# Patient Record
Sex: Female | Born: 1980 | Race: Black or African American | Hispanic: No | Marital: Single | Smoking: Never smoker
Health system: Southern US, Community
[De-identification: ages and names within clinical notes are randomized; demographics above are authoritative.]

## PROBLEM LIST (undated history)

## (undated) DIAGNOSIS — R519 Headache, unspecified: Secondary | ICD-10-CM

## (undated) DIAGNOSIS — N83209 Unspecified ovarian cyst, unspecified side: Secondary | ICD-10-CM

## (undated) DIAGNOSIS — R51 Headache: Secondary | ICD-10-CM

## (undated) HISTORY — DX: Headache: R51

## (undated) HISTORY — PX: TUBAL LIGATION: SHX77

## (undated) HISTORY — DX: Headache, unspecified: R51.9

---

## 2009-02-23 ENCOUNTER — Emergency Department (HOSPITAL_COMMUNITY): Admission: EM | Admit: 2009-02-23 | Discharge: 2009-02-23 | Payer: Self-pay | Admitting: Emergency Medicine

## 2013-09-16 ENCOUNTER — Encounter (HOSPITAL_COMMUNITY): Payer: Self-pay | Admitting: Emergency Medicine

## 2013-09-16 ENCOUNTER — Emergency Department (HOSPITAL_COMMUNITY)
Admission: EM | Admit: 2013-09-16 | Discharge: 2013-09-17 | Disposition: A | Discharge status: Home / Self Care | Payer: 59 | Attending: Emergency Medicine | Admitting: Emergency Medicine

## 2013-09-16 ENCOUNTER — Emergency Department (HOSPITAL_COMMUNITY): Payer: 59

## 2013-09-16 DIAGNOSIS — N898 Other specified noninflammatory disorders of vagina: Secondary | ICD-10-CM | POA: Diagnosis not present

## 2013-09-16 DIAGNOSIS — Z79899 Other long term (current) drug therapy: Secondary | ICD-10-CM | POA: Diagnosis not present

## 2013-09-16 DIAGNOSIS — Z9889 Other specified postprocedural states: Secondary | ICD-10-CM | POA: Diagnosis not present

## 2013-09-16 DIAGNOSIS — Z3202 Encounter for pregnancy test, result negative: Secondary | ICD-10-CM | POA: Diagnosis not present

## 2013-09-16 DIAGNOSIS — Z87448 Personal history of other diseases of urinary system: Secondary | ICD-10-CM | POA: Insufficient documentation

## 2013-09-16 DIAGNOSIS — R1032 Left lower quadrant pain: Secondary | ICD-10-CM | POA: Diagnosis not present

## 2013-09-16 HISTORY — DX: Unspecified ovarian cyst, unspecified side: N83.209

## 2013-09-16 LAB — CBC WITH DIFFERENTIAL/PLATELET
Basophils Absolute: 0 10*3/uL (ref 0.0–0.1)
Basophils Relative: 0 % (ref 0–1)
EOS PCT: 1 % (ref 0–5)
Eosinophils Absolute: 0.1 10*3/uL (ref 0.0–0.7)
HEMATOCRIT: 36.3 % (ref 36.0–46.0)
Hemoglobin: 11.8 g/dL — ABNORMAL LOW (ref 12.0–15.0)
LYMPHS ABS: 2.5 10*3/uL (ref 0.7–4.0)
Lymphocytes Relative: 39 % (ref 12–46)
MCH: 27.9 pg (ref 26.0–34.0)
MCHC: 32.5 g/dL (ref 30.0–36.0)
MCV: 85.8 fL (ref 78.0–100.0)
Monocytes Absolute: 0.6 10*3/uL (ref 0.1–1.0)
Monocytes Relative: 9 % (ref 3–12)
Neutro Abs: 3.1 10*3/uL (ref 1.7–7.7)
Neutrophils Relative %: 51 % (ref 43–77)
Platelets: 257 10*3/uL (ref 150–400)
RBC: 4.23 MIL/uL (ref 3.87–5.11)
RDW: 13.4 % (ref 11.5–15.5)
WBC: 6.3 10*3/uL (ref 4.0–10.5)

## 2013-09-16 LAB — URINALYSIS, ROUTINE W REFLEX MICROSCOPIC
Bilirubin Urine: NEGATIVE
GLUCOSE, UA: NEGATIVE mg/dL
KETONES UR: NEGATIVE mg/dL
Nitrite: NEGATIVE
PROTEIN: NEGATIVE mg/dL
Specific Gravity, Urine: 1.026 (ref 1.005–1.030)
UROBILINOGEN UA: 1 mg/dL (ref 0.0–1.0)
pH: 5.5 (ref 5.0–8.0)

## 2013-09-16 LAB — COMPREHENSIVE METABOLIC PANEL
ALT: 15 U/L (ref 0–35)
AST: 20 U/L (ref 0–37)
Albumin: 3.3 g/dL — ABNORMAL LOW (ref 3.5–5.2)
Alkaline Phosphatase: 77 U/L (ref 39–117)
Anion gap: 10 (ref 5–15)
BUN: 9 mg/dL (ref 6–23)
CALCIUM: 9 mg/dL (ref 8.4–10.5)
CO2: 23 meq/L (ref 19–32)
Chloride: 105 mEq/L (ref 96–112)
Creatinine, Ser: 0.8 mg/dL (ref 0.50–1.10)
Glucose, Bld: 89 mg/dL (ref 70–99)
Potassium: 3.9 mEq/L (ref 3.7–5.3)
SODIUM: 138 meq/L (ref 137–147)
TOTAL PROTEIN: 7.3 g/dL (ref 6.0–8.3)
Total Bilirubin: <0.2 mg/dL — ABNORMAL LOW (ref 0.3–1.2)

## 2013-09-16 LAB — WET PREP, GENITAL
CLUE CELLS WET PREP: NONE SEEN
TRICH WET PREP: NONE SEEN
Yeast Wet Prep HPF POC: NONE SEEN

## 2013-09-16 LAB — URINE MICROSCOPIC-ADD ON

## 2013-09-16 LAB — LIPASE, BLOOD: Lipase: 32 U/L (ref 11–59)

## 2013-09-16 LAB — PREGNANCY, URINE: PREG TEST UR: NEGATIVE

## 2013-09-16 MED ORDER — SODIUM CHLORIDE 0.9 % IV BOLUS (SEPSIS)
1000.0000 mL | Freq: Once | INTRAVENOUS | Status: AC
  Administered 2013-09-16: 1000 mL via INTRAVENOUS

## 2013-09-16 MED ORDER — KETOROLAC TROMETHAMINE 30 MG/ML IJ SOLN
30.0000 mg | Freq: Once | INTRAMUSCULAR | Status: AC
  Administered 2013-09-16: 30 mg via INTRAVENOUS
  Filled 2013-09-16: qty 1

## 2013-09-16 NOTE — ED Notes (Signed)
Pt c/o lower abd pain that started this morning, denies n/v, bleeding or discharge.

## 2013-09-16 NOTE — ED Provider Notes (Signed)
CSN: [id number]     Arrival date & time 09/16/13  1621 History   First MD Initiated Contact with Patient 09/16/13 2007     Chief Complaint  Patient presents with  . Abdominal Pain   Patient is a 33 y.o. female presenting with abdominal pain. The history is provided by the patient. No language interpreter was used.  Abdominal Pain Pain location:  LLQ Pain quality: aching and cramping   Pain radiates to:  Does not radiate Pain severity:  Moderate Onset quality:  Gradual Duration:  1 day Timing:  Intermittent Progression:  Worsening Chronicity:  New Context: recent sexual activity   Context: not alcohol use, not awakening from sleep, not diet changes, not eating, not laxative use, not medication withdrawal, not previous surgeries, not recent illness, not recent travel, not retching, not sick contacts, not suspicious food intake and not trauma   Relieved by:  Nothing Worsened by:  Nothing tried Ineffective treatments:  None tried Associated symptoms: vaginal bleeding   Associated symptoms: no anorexia, no belching, no chest pain, no chills, no constipation, no cough, no diarrhea, no dysuria, no fatigue, no fever, no flatus, no hematemesis, no hematochezia, no hematuria, no melena, no nausea, no shortness of breath, no sore throat, no vaginal discharge and no vomiting   Associated symptoms comment:  Vaginal bleeding, Patient on her menstrual cycle Risk factors: no alcohol abuse, no aspirin use, not elderly, has not had multiple surgeries, no NSAID use, not obese, not pregnant and no recent hospitalization     Past Medical History  Diagnosis Date  . Ruptured cyst of ovary right   Past Surgical History  Procedure Laterality Date  . Cesarean section     No family history on file. History  Substance Use Topics  . Smoking status: Never Smoker   . Smokeless tobacco: Not on file  . Alcohol Use: No   OB History   Grav Para Term Preterm Abortions TAB SAB Ect Mult Living                  Review of Systems  Constitutional: Negative for fever, chills and fatigue.  HENT: Negative for sore throat.   Respiratory: Negative for cough and shortness of breath.   Cardiovascular: Negative for chest pain.  Gastrointestinal: Positive for abdominal pain. Negative for nausea, vomiting, diarrhea, constipation, melena, hematochezia, anorexia, flatus and hematemesis.  Genitourinary: Positive for vaginal bleeding. Negative for dysuria, hematuria and vaginal discharge.      Allergies  Zantac  Home Medications   Prior to Admission medications   Medication Sig Start Date End Date Taking? Authorizing Provider  ferrous sulfate 325 (65 FE) MG tablet Take 325 mg by mouth 2 (two) times daily with a meal.   Yes Historical Provider, MD  traMADol (ULTRAM) 50 MG tablet Take 1 tablet (50 mg total) by mouth every 6 (six) hours as needed. 09/17/13   Riyansh Gerstner A Forcucci, PA-C   BP 101/57  Pulse 72  Temp(Src) 98.7 F (37.1 C) (Oral)  Resp 18  SpO2 100%  LMP 09/12/2013 Physical Exam  Nursing note and vitals reviewed. Constitutional: She is oriented to person, place, and time. She appears well-developed and well-nourished. No distress.  HENT:  Head: Normocephalic and atraumatic.  Mouth/Throat: Oropharynx is clear and moist. No oropharyngeal exudate.  Eyes: Conjunctivae and EOM are normal. Pupils are equal, round, and reactive to light. No scleral icterus.  Neck: Normal range of motion. Neck supple. No JVD present. No thyromegaly present.  Cardiovascular: Normal  rate, regular rhythm, normal heart sounds and intact distal pulses.  Exam reveals no gallop and no friction rub.   No murmur heard. Pulmonary/Chest: Effort normal and breath sounds normal. No respiratory distress. She has no wheezes. She has no rales. She exhibits no tenderness.  Abdominal: Soft. Normal appearance and bowel sounds are normal. She exhibits no distension and no mass. There is tenderness in the suprapubic area and left lower  quadrant. There is no rigidity, no rebound, no guarding, no CVA tenderness, no tenderness at McBurney's point and negative Murphy's sign.  Genitourinary: Vagina normal. No labial fusion. There is no rash, tenderness, lesion or injury on the right labia. There is no rash, tenderness, lesion or injury on the left labia. Cervix exhibits discharge. Cervix exhibits no motion tenderness and no friability. Right adnexum displays no mass, no tenderness and no fullness. Left adnexum displays tenderness. Left adnexum displays no mass and no fullness. No erythema, tenderness or bleeding around the vagina. No foreign body around the vagina. No signs of injury around the vagina. No vaginal discharge found.  Scant amounts of brown blood in the vaginal vault  Musculoskeletal: Normal range of motion.  Lymphadenopathy:    She has no cervical adenopathy.  Neurological: She is alert and oriented to person, place, and time.  Skin: Skin is warm and dry. She is not diaphoretic.  Psychiatric: She has a normal mood and affect. Her behavior is normal. Judgment and thought content normal.    ED Course  Procedures (including critical care time) Labs Review Labs Reviewed  WET PREP, GENITAL - Abnormal; Notable for the following:    WBC, Wet Prep HPF POC FEW (*)    All other components within normal limits  CBC WITH DIFFERENTIAL - Abnormal; Notable for the following:    Hemoglobin 11.8 (*)    All other components within normal limits  COMPREHENSIVE METABOLIC PANEL - Abnormal; Notable for the following:    Albumin 3.3 (*)    Total Bilirubin <0.2 (*)    All other components within normal limits  URINALYSIS, ROUTINE W REFLEX MICROSCOPIC - Abnormal; Notable for the following:    Color, Urine AMBER (*)    APPearance CLOUDY (*)    Hgb urine dipstick LARGE (*)    Leukocytes, UA SMALL (*)    All other components within normal limits  URINE MICROSCOPIC-ADD ON - Abnormal; Notable for the following:    Squamous Epithelial /  LPF MANY (*)    Bacteria, UA FEW (*)    All other components within normal limits  GC/CHLAMYDIA PROBE AMP  LIPASE, BLOOD  PREGNANCY, URINE  HIV ANTIBODY (ROUTINE TESTING)    Imaging Review [place] Transvaginal Non-ob  09/17/2013   CLINICAL DATA:  Abdominal pain.  Rule out ruptured ovarian cyst.  EXAM: TRANSABDOMINAL AND TRANSVAGINAL ULTRASOUND OF PELVIS  TECHNIQUE: Both transabdominal and transvaginal ultrasound examinations of the pelvis were performed. Transabdominal technique was performed for global imaging of the pelvis including uterus, ovaries, adnexal regions, and pelvic cul-de-sac. It was necessary to proceed with endovaginal exam following the transabdominal exam to visualize the ovaries.  COMPARISON:  None  FINDINGS: Uterus  Measurements: 9.0 x 4.2 x 5.4 cm. No fibroids or other mass visualized.  Endometrium  Thickness: 9.4 mm.  No focal abnormality visualized.  Right ovary  Measurements: 2.9 x 1.5 x 1.7 cm. Normal appearance/no adnexal mass.  Left ovary  Measurements: 3.9 x 2.6 x 4.1 cm. Normal appearance/no adnexal mass.  Other findings  No free fluid.  IMPRESSION: Negative   Electronically Signed   By: Marlan Palau M.D.   On: 09/17/2013 00:56   US Pelvis Complete  09/17/2013   CLINICAL DATA:  Abdominal pain.  Rule out ruptured ovarian cyst.  EXAM: TRANSABDOMINAL AND TRANSVAGINAL ULTRASOUND OF PELVIS  TECHNIQUE: Both transabdominal and transvaginal ultrasound examinations of the pelvis were performed. Transabdominal technique was performed for global imaging of the pelvis including uterus, ovaries, adnexal regions, and pelvic cul-de-sac. It was necessary to proceed with endovaginal exam following the transabdominal exam to visualize the ovaries.  COMPARISON:  None  FINDINGS: Uterus  Measurements: 9.0 x 4.2 x 5.4 cm. No fibroids or other mass visualized.  Endometrium  Thickness: 9.4 mm.  No focal abnormality visualized.  Right ovary  Measurements: 2.9 x 1.5 x 1.7 cm. Normal appearance/no adnexal  mass.  Left ovary  Measurements: 3.9 x 2.6 x 4.1 cm. Normal appearance/no adnexal mass.  Other findings  No free fluid.  IMPRESSION: Negative   Electronically Signed   By: Marlan Palau M.D.   On: 09/17/2013 00:56     EKG Interpretation None      MDM   Final diagnoses:  LLQ abdominal pain   Patient is a 33 y.o. Female who presents to the ED with LLQ and suprapubic abdominal pain x 1 day.  CBC, CMP, Lipase are unremarkable at this time.  GC and rapid HIV are currently pending at this time.  Patient did have some mild left adnexal tenderness and history of ovarian cysts.  Will obtain a pelvic ultrasound at this time to rule out TOA and ovarian torsion at this time.  Pelvic ultrasound shows no acute abnormalities at this time.  UA appears to be contaminated at this time as patient is currently on her menstrual cycle.  Labs are reassuring at this time and pelvic ultrasound is negative at this time for torsion.  Patient is stable for discharge at this time.  I will send the patient home with tramadol for pain management at this time and have told the patient to follow-up with her PCP this week.  Patient was given strict return precautions of GI bleeding, fever, intractable abdominal pain, and intractable nausea and vomiting.  Patient was discussed with Dr. Rhunette Croft and he agrees with the above plan.    Eben Burow, PA-C 09/17/13 [phone]

## 2013-09-17 LAB — HIV ANTIBODY (ROUTINE TESTING W REFLEX): HIV: NONREACTIVE

## 2013-09-17 LAB — GC/CHLAMYDIA PROBE AMP
CT Probe RNA: NEGATIVE
GC Probe RNA: NEGATIVE

## 2013-09-17 MED ORDER — TRAMADOL HCL 50 MG PO TABS: 50.0000 mg | ORAL_TABLET | Freq: Four times a day (QID) | ORAL | Status: DC | PRN

## 2013-09-17 NOTE — Discharge Instructions (Signed)
Abdominal Pain, Women °Abdominal (stomach, pelvic, or belly) pain can be caused by many things. It is important to tell your doctor: °· The location of the pain. °· Does it come and go or is it present all the time? °· Are there things that start the pain (eating certain foods, exercise)? °· Are there other symptoms associated with the pain (fever, nausea, vomiting, diarrhea)? °All of this is helpful to know when trying to find the cause of the pain. °CAUSES  °· Stomach: virus or bacteria infection, or ulcer. °· Intestine: appendicitis (inflamed appendix), regional ileitis (Crohn's disease), ulcerative colitis (inflamed colon), irritable bowel syndrome, diverticulitis (inflamed diverticulum of the colon), or cancer of the stomach or intestine. °· Gallbladder disease or stones in the gallbladder. °· Kidney disease, kidney stones, or infection. °· Pancreas infection or cancer. °· Fibromyalgia (pain disorder). °· Diseases of the female organs: °¨ Uterus: fibroid (non-cancerous) tumors or infection. °¨ Fallopian tubes: infection or tubal pregnancy. °¨ Ovary: cysts or tumors. °¨ Pelvic adhesions (scar tissue). °¨ Endometriosis (uterus lining tissue growing in the pelvis and on the pelvic organs). °¨ Pelvic congestion syndrome (female organs filling up with blood just before the menstrual period). °¨ Pain with the menstrual period. °¨ Pain with ovulation (producing an egg). °¨ Pain with an IUD (intrauterine device, birth control) in the uterus. °¨ Cancer of the female organs. °· Functional pain (pain not caused by a disease, may improve without treatment). °· Psychological pain. °· Depression. °DIAGNOSIS  °Your doctor will decide the seriousness of your pain by doing an examination. °· Blood tests. °· X-rays. °· Ultrasound. °· CT scan (computed tomography, special type of X-ray). °· MRI (magnetic resonance imaging). °· Cultures, for infection. °· Barium enema (dye inserted in the large intestine, to better view it with  X-rays). °· Colonoscopy (looking in intestine with a lighted tube). °· Laparoscopy (minor surgery, looking in abdomen with a lighted tube). °· Major abdominal exploratory surgery (looking in abdomen with a large incision). °TREATMENT  °The treatment will depend on the cause of the pain.  °· Many cases can be observed and treated at home. °· Over-the-counter medicines recommended by your caregiver. °· Prescription medicine. °· Antibiotics, for infection. °· Birth control pills, for painful periods or for ovulation pain. °· Hormone treatment, for endometriosis. °· Nerve blocking injections. °· Physical therapy. °· Antidepressants. °· Counseling with a psychologist or psychiatrist. °· Minor or major surgery. °HOME CARE INSTRUCTIONS  °· Do not take laxatives, unless directed by your caregiver. °· Take over-the-counter pain medicine only if ordered by your caregiver. Do not take aspirin because it can cause an upset stomach or bleeding. °· Try a clear liquid diet (broth or water) as ordered by your caregiver. Slowly move to a bland diet, as tolerated, if the pain is related to the stomach or intestine. °· Have a thermometer and take your temperature several times a day, and record it. °· Bed rest and sleep, if it helps the pain. °· Avoid sexual intercourse, if it causes pain. °· Avoid stressful situations. °· Keep your follow-up appointments and tests, as your caregiver orders. °· If the pain does not go away with medicine or surgery, you may try: °¨ Acupuncture. °¨ Relaxation exercises (yoga, meditation). °¨ Group therapy. °¨ Counseling. °SEEK MEDICAL CARE IF:  °· You notice certain foods cause stomach pain. °· Your home care treatment is not helping your pain. °· You need stronger pain medicine. °· You want your IUD removed. °· You feel faint or   lightheaded. °· You develop nausea and vomiting. °· You develop a rash. °· You are having side effects or an allergy to your medicine. °SEEK IMMEDIATE MEDICAL CARE IF:  °· Your  pain does not go away or gets worse. °· You have a fever. °· Your pain is felt only in portions of the abdomen. The right side could possibly be appendicitis. The left lower portion of the abdomen could be colitis or diverticulitis. °· You are passing blood in your stools (bright red or black tarry stools, with or without vomiting). °· You have blood in your urine. °· You develop chills, with or without a fever. °· You pass out. °MAKE SURE YOU:  °· Understand these instructions. °· Will watch your condition. °· Will get help right away if you are not doing well or get worse. °Document Released: 11/28/2006 Document Revised: 06/17/2013 Document Reviewed: 12/18/2008 °[employer]® Patient Information ©2015 [employer]. This information is not intended to replace advice given to you by your health care provider. Make sure you discuss any questions you have with your health care provider. ° °

## 2013-09-17 NOTE — ED Notes (Signed)
Patient transported to Ultrasound 

## 2013-09-27 NOTE — ED Provider Notes (Signed)
Medical screening examination/treatment/procedure(s) were performed by non-physician practitioner and as supervising physician I was immediately available for consultation/collaboration.   EKG Interpretation None       Derwood KaplanAnkit Adriene Padula, MD 09/27/13 [phone]

## 2013-10-03 ENCOUNTER — Encounter (HOSPITAL_COMMUNITY): Payer: Self-pay | Admitting: Emergency Medicine

## 2013-10-03 DIAGNOSIS — Z8742 Personal history of other diseases of the female genital tract: Secondary | ICD-10-CM | POA: Insufficient documentation

## 2013-10-03 DIAGNOSIS — Z792 Long term (current) use of antibiotics: Secondary | ICD-10-CM | POA: Diagnosis not present

## 2013-10-03 DIAGNOSIS — R52 Pain, unspecified: Secondary | ICD-10-CM | POA: Diagnosis not present

## 2013-10-03 DIAGNOSIS — Z79899 Other long term (current) drug therapy: Secondary | ICD-10-CM | POA: Diagnosis not present

## 2013-10-03 DIAGNOSIS — R509 Fever, unspecified: Secondary | ICD-10-CM | POA: Insufficient documentation

## 2013-10-03 DIAGNOSIS — J069 Acute upper respiratory infection, unspecified: Secondary | ICD-10-CM | POA: Diagnosis not present

## 2013-10-03 LAB — CBC WITH DIFFERENTIAL/PLATELET
Basophils Absolute: 0 10*3/uL (ref 0.0–0.1)
Basophils Relative: 0 % (ref 0–1)
EOS ABS: 0 10*3/uL (ref 0.0–0.7)
EOS PCT: 0 % (ref 0–5)
HCT: 40.8 % (ref 36.0–46.0)
HEMOGLOBIN: 13.7 g/dL (ref 12.0–15.0)
LYMPHS ABS: 1.2 10*3/uL (ref 0.7–4.0)
Lymphocytes Relative: 20 % (ref 12–46)
MCH: 28.1 pg (ref 26.0–34.0)
MCHC: 33.6 g/dL (ref 30.0–36.0)
MCV: 83.8 fL (ref 78.0–100.0)
MONO ABS: 0.9 10*3/uL (ref 0.1–1.0)
MONOS PCT: 15 % — AB (ref 3–12)
Neutro Abs: 3.7 10*3/uL (ref 1.7–7.7)
Neutrophils Relative %: 65 % (ref 43–77)
Platelets: 235 10*3/uL (ref 150–400)
RBC: 4.87 MIL/uL (ref 3.87–5.11)
RDW: 13 % (ref 11.5–15.5)
WBC: 5.8 10*3/uL (ref 4.0–10.5)

## 2013-10-03 LAB — I-STAT CG4 LACTIC ACID, ED: LACTIC ACID, VENOUS: 1.54 mmol/L (ref 0.5–2.2)

## 2013-10-03 NOTE — ED Notes (Addendum)
Pt reports generalized body aches and fever x 3 days. States she checked it today at it was 104.2. States she took 500 mg Tylenol appx 4 hours ago. Denies pain. Denies urinary symp. Denies cough/CP. PT AO x 4. NAD.

## 2013-10-04 ENCOUNTER — Emergency Department (HOSPITAL_COMMUNITY)
Admission: EM | Admit: 2013-10-04 | Discharge: 2013-10-04 | Disposition: A | Discharge status: Home / Self Care | Payer: 59 | Attending: Emergency Medicine | Admitting: Emergency Medicine

## 2013-10-04 DIAGNOSIS — J069 Acute upper respiratory infection, unspecified: Secondary | ICD-10-CM

## 2013-10-04 LAB — COMPREHENSIVE METABOLIC PANEL
ALK PHOS: 85 U/L (ref 39–117)
ALT: 53 U/L — ABNORMAL HIGH (ref 0–35)
AST: 52 U/L — ABNORMAL HIGH (ref 0–37)
Albumin: 3.5 g/dL (ref 3.5–5.2)
Anion gap: 12 (ref 5–15)
BUN: 9 mg/dL (ref 6–23)
CALCIUM: 9.8 mg/dL (ref 8.4–10.5)
CO2: 23 mEq/L (ref 19–32)
Chloride: 99 mEq/L (ref 96–112)
Creatinine, Ser: 0.76 mg/dL (ref 0.50–1.10)
GFR calc non Af Amer: >90 mL/min (ref >90)
Glucose, Bld: 117 mg/dL — ABNORMAL HIGH (ref 70–99)
Potassium: 4.6 mEq/L (ref 3.7–5.3)
Sodium: 134 mEq/L — ABNORMAL LOW (ref 137–147)
TOTAL PROTEIN: 8.5 g/dL — AB (ref 6.0–8.3)
Total Bilirubin: 0.2 mg/dL — ABNORMAL LOW (ref 0.3–1.2)

## 2013-10-04 MED ORDER — AZITHROMYCIN 250 MG PO TABS: ORAL_TABLET | ORAL | Status: DC

## 2013-10-04 MED ORDER — ACETAMINOPHEN 325 MG PO TABS
650.0000 mg | ORAL_TABLET | Freq: Once | ORAL | Status: AC
  Administered 2013-10-04: 650 mg via ORAL
  Filled 2013-10-04: qty 2

## 2013-10-04 NOTE — Discharge Instructions (Signed)
Get plenty of rest, drink a lot of fluids. Use Tylenol, for fever.  Start the prescription if you get worse or develop a cough productive of abnormal sputum (discolored).    Upper Respiratory Infection, Adult An upper respiratory infection (URI) is also sometimes known as the common cold. The upper respiratory tract includes the nose, sinuses, throat, trachea, and bronchi. Bronchi are the airways leading to the lungs. Most people improve within 1 week, but symptoms can last up to 2 weeks. A residual cough may last even longer.  CAUSES Many different viruses can infect the tissues lining the upper respiratory tract. The tissues become irritated and inflamed and often become very moist. Mucus production is also common. A cold is contagious. You can easily spread the virus to others by oral contact. This includes kissing, sharing a glass, coughing, or sneezing. Touching your mouth or nose and then touching a surface, which is then touched by another person, can also spread the virus. SYMPTOMS  Symptoms typically develop 1 to 3 days after you come in contact with a cold virus. Symptoms vary from person to person. They may include:  Runny nose.  Sneezing.  Nasal congestion.  Sinus irritation.  Sore throat.  Loss of voice (laryngitis).  Cough.  Fatigue.  Muscle aches.  Loss of appetite.  Headache.  Low-grade fever. DIAGNOSIS  You might diagnose your own cold based on familiar symptoms, since most people get a cold 2 to 3 times a year. Your caregiver can confirm this based on your exam. Most importantly, your caregiver can check that your symptoms are not due to another disease such as strep throat, sinusitis, pneumonia, asthma, or epiglottitis. Blood tests, throat tests, and X-rays are not necessary to diagnose a common cold, but they may sometimes be helpful in excluding other more serious diseases. Your caregiver will decide if any further tests are required. RISKS AND  COMPLICATIONS  You may be at risk for a more severe case of the common cold if you smoke cigarettes, have chronic heart disease (such as heart failure) or lung disease (such as asthma), or if you have a weakened immune system. The very young and very old are also at risk for more serious infections. Bacterial sinusitis, middle ear infections, and bacterial pneumonia can complicate the common cold. The common cold can worsen asthma and chronic obstructive pulmonary disease (COPD). Sometimes, these complications can require emergency medical care and may be life-threatening. PREVENTION  The best way to protect against getting a cold is to practice good hygiene. Avoid oral or hand contact with people with cold symptoms. Wash your hands often if contact occurs. There is no clear evidence that vitamin C, vitamin E, echinacea, or exercise reduces the chance of developing a cold. However, it is always recommended to get plenty of rest and practice good nutrition. TREATMENT  Treatment is directed at relieving symptoms. There is no cure. Antibiotics are not effective, because the infection is caused by a virus, not by bacteria. Treatment may include:  Increased fluid intake. Sports drinks offer valuable electrolytes, sugars, and fluids.  Breathing heated mist or steam (vaporizer or shower).  Eating chicken soup or other clear broths, and maintaining good nutrition.  Getting plenty of rest.  Using gargles or lozenges for comfort.  Controlling fevers with ibuprofen or acetaminophen as directed by your caregiver.  Increasing usage of your inhaler if you have asthma. Zinc gel and zinc lozenges, taken in the first 24 hours of the common cold, can shorten the  duration and lessen the severity of symptoms. Pain medicines may help with fever, muscle aches, and throat pain. A variety of non-prescription medicines are available to treat congestion and runny nose. Your caregiver can make recommendations and may  suggest nasal or lung inhalers for other symptoms.  HOME CARE INSTRUCTIONS   Only take over-the-counter or prescription medicines for pain, discomfort, or fever as directed by your caregiver.  Use a warm mist humidifier or inhale steam from a shower to increase air moisture. This may keep secretions moist and make it easier to breathe.  Drink enough water and fluids to keep your urine clear or pale yellow.  Rest as needed.  Return to work when your temperature has returned to normal or as your caregiver advises. You may need to stay home longer to avoid infecting others. You can also use a face mask and careful hand washing to prevent spread of the virus. SEEK MEDICAL CARE IF:   After the first few days, you feel you are getting worse rather than better.  You need your caregiver's advice about medicines to control symptoms.  You develop chills, worsening shortness of breath, or brown or red sputum. These may be signs of pneumonia.  You develop yellow or brown nasal discharge or pain in the face, especially when you bend forward. These may be signs of sinusitis.  You develop a fever, swollen neck glands, pain with swallowing, or white areas in the back of your throat. These may be signs of strep throat. SEEK IMMEDIATE MEDICAL CARE IF:   You have a fever.  You develop severe or persistent headache, ear pain, sinus pain, or chest pain.  You develop wheezing, a prolonged cough, cough up blood, or have a change in your usual mucus (if you have chronic lung disease).  You develop sore muscles or a stiff neck. Document Released: 07/27/2000 Document Revised: 04/25/2011 Document Reviewed: 05/08/2013 [facility] Patient Information 2015 [place], [place]. This information is not intended to replace advice given to you by your health care provider. Make sure you discuss any questions you have with your health care provider.

## 2013-10-04 NOTE — ED Provider Notes (Signed)
CSN: [id number]     Arrival date & time 10/03/13  2234 History   First MD Initiated Contact with Patient 10/04/13 0129     Chief Complaint  Patient presents with  . Fever  . Generalized Body Aches     (Consider location/radiation/quality/duration/timing/severity/associated sxs/prior Treatment) HPI Amy Castro is a 33 y.o. female who presents for evaluation of general achiness sneezing, coughing, sore throat. She denies productive cough. She has not had a fever. No sick contacts. She denies nausea, vomiting, dysuria, urinary frequency, or diarrhea. There are no other known modifying factors  Past Medical History  Diagnosis Date  . Ruptured cyst of ovary right   Past Surgical History  Procedure Laterality Date  . Cesarean section     No family history on file. History  Substance Use Topics  . Smoking status: Never Smoker   . Smokeless tobacco: Not on file  . Alcohol Use: No   OB History   Grav Para Term Preterm Abortions TAB SAB Ect Mult Living                 Review of Systems  All other systems reviewed and are negative.     Allergies  Zantac  Home Medications   Prior to Admission medications   Medication Sig Start Date End Date Taking? Authorizing Provider  azithromycin (ZITHROMAX Z-PAK) 250 MG tablet 2 po day one, then 1 daily x 4 days 10/04/13   Flint Melter, MD  ferrous sulfate 325 (65 FE) MG tablet Take 325 mg by mouth 2 (two) times daily with a meal.    Historical Provider, MD  traMADol (ULTRAM) 50 MG tablet Take 1 tablet (50 mg total) by mouth every 6 (six) hours as needed. 09/17/13   Courtney A Forcucci, PA-C   BP 139/51  Pulse 107  Temp(Src) 103 F (39.4 C) (Oral)  Resp 18  SpO2 100%  LMP 09/12/2013 Physical Exam  Nursing note and vitals reviewed. Constitutional: She is oriented to person, place, and time. She appears well-developed and well-nourished. No distress.  HENT:  Head: Normocephalic and atraumatic.  Eyes: Conjunctivae and EOM are  normal. Pupils are equal, round, and reactive to light.  Neck: Normal range of motion and phonation normal. Neck supple.  Cardiovascular: Normal rate, regular rhythm and intact distal pulses.   Pulmonary/Chest: Effort normal and breath sounds normal. No respiratory distress. She has no wheezes. She has no rales. She exhibits no tenderness.  Few scattered rhonchi, primarily left-sided.  Abdominal: Soft. She exhibits no distension. There is no tenderness. There is no guarding.  Musculoskeletal: Normal range of motion.  Neurological: She is alert and oriented to person, place, and time. She exhibits normal muscle tone.  Skin: Skin is warm and dry.  Psychiatric: She has a normal mood and affect. Her behavior is normal. Judgment and thought content normal.    ED Course  Procedures (including critical care time) . Findings this is a patient, all questions answered.  Labs Review Labs Reviewed  CBC WITH DIFFERENTIAL - Abnormal; Notable for the following:    Monocytes Relative 15 (*)    All other components within normal limits  COMPREHENSIVE METABOLIC PANEL - Abnormal; Notable for the following:    Sodium 134 (*)    Glucose, Bld 117 (*)    Total Protein 8.5 (*)    AST 52 (*)    ALT 53 (*)    Total Bilirubin 0.2 (*)    All other components within normal limits  URINE  CULTURE  URINALYSIS, ROUTINE W REFLEX MICROSCOPIC  I-STAT CG4 LACTIC ACID, ED    Imaging Review No results found.   EKG Interpretation None      MDM   Final diagnoses:  URI (upper respiratory infection)    Which is consistent with a URI. Patient is nontoxic. She is given a prescription for Zithromax to use on a wait and see approach. She plans on starting it, if her symptoms worsen.  Nursing Notes Reviewed/ Care Coordinated Applicable Imaging Reviewed Interpretation of Laboratory Data incorporated into ED treatment  The patient appears reasonably screened and/or stabilized for discharge and I doubt any other  medical condition or other [facility] requiring further screening, evaluation, or treatment in the ED at this time prior to discharge.  Plan: Home Medications- Z-pack, Tylenol; Home Treatments- rest; return here if the recommended treatment, does not improve the symptoms; Recommended follow up- PCP prn   Flint MelterElliott L Peter Keyworth, MD 10/04/13 0200

## 2014-05-05 ENCOUNTER — Other Ambulatory Visit (HOSPITAL_COMMUNITY)
Admission: RE | Admit: 2014-05-05 | Discharge: 2014-05-05 | Disposition: A | Discharge status: Home / Self Care | Payer: Federal, State, Local not specified - PPO | Source: Ambulatory Visit | Attending: Obstetrics & Gynecology | Admitting: Obstetrics & Gynecology

## 2014-05-05 ENCOUNTER — Other Ambulatory Visit: Payer: Self-pay | Admitting: Obstetrics & Gynecology

## 2014-05-05 DIAGNOSIS — Z01411 Encounter for gynecological examination (general) (routine) with abnormal findings: Secondary | ICD-10-CM | POA: Diagnosis not present

## 2014-05-05 DIAGNOSIS — Z1151 Encounter for screening for human papillomavirus (HPV): Secondary | ICD-10-CM | POA: Diagnosis not present

## 2014-05-06 LAB — CYTOLOGY - PAP

## 2015-05-18 DIAGNOSIS — R609 Edema, unspecified: Secondary | ICD-10-CM | POA: Diagnosis not present

## 2015-06-25 DIAGNOSIS — K080 Exfoliation of teeth due to systemic causes: Secondary | ICD-10-CM | POA: Diagnosis not present

## 2015-06-29 ENCOUNTER — Ambulatory Visit (INDEPENDENT_AMBULATORY_CARE_PROVIDER_SITE_OTHER): Payer: Federal, State, Local not specified - PPO | Admitting: Podiatry

## 2015-06-29 ENCOUNTER — Ambulatory Visit (INDEPENDENT_AMBULATORY_CARE_PROVIDER_SITE_OTHER): Payer: Federal, State, Local not specified - PPO

## 2015-06-29 ENCOUNTER — Encounter: Payer: Self-pay | Admitting: Podiatry

## 2015-06-29 VITALS — BP 109/65 | HR 70 | Resp 18

## 2015-06-29 DIAGNOSIS — M25579 Pain in unspecified ankle and joints of unspecified foot: Secondary | ICD-10-CM

## 2015-06-29 DIAGNOSIS — R52 Pain, unspecified: Secondary | ICD-10-CM | POA: Diagnosis not present

## 2015-06-29 DIAGNOSIS — G5750 Tarsal tunnel syndrome, unspecified lower limb: Secondary | ICD-10-CM

## 2015-06-29 DIAGNOSIS — M779 Enthesopathy, unspecified: Secondary | ICD-10-CM

## 2015-06-29 DIAGNOSIS — M2140 Flat foot [pes planus] (acquired), unspecified foot: Secondary | ICD-10-CM | POA: Diagnosis not present

## 2015-06-29 DIAGNOSIS — R609 Edema, unspecified: Secondary | ICD-10-CM

## 2015-06-29 MED ORDER — MELOXICAM 15 MG PO TABS: 15.0000 mg | ORAL_TABLET | Freq: Every day | ORAL | Status: AC

## 2015-06-29 NOTE — Progress Notes (Signed)
   Subjective:    Patient ID: Amy Castro, female    DOB: 02-11-1981, 35 y.o.   MRN: [id number]  HPI  35 year old female presents the office today for concerns of bilateral foot pain and swelling. She says the swelling has been ongoing for about 1 month his been consistent. She can start new birth control prior to this the swelling started. She does get some intermittent discomfort of the foot she points the outside part of her foot when it does hurt. She states that she cannot identify when the pain starts to what causes the pain to start. She states that she can stand all day at work and having no pain that she can get some discomfort after sitting down for some time. She's had no recent treatment. No recent injury or trauma. No warmth of the foot. No other complaints.    Review of Systems  All other systems reviewed and are negative.      Objective:   Physical Exam General: AAO x3, NAD  Dermatological: Skin is warm, dry and supple bilateral. Nails x 10 are well manicured; remaining integument appears unremarkable at this time. There are no open sores, no preulcerative lesions, no rash or signs of infection present.  Vascular: Dorsalis Pedis artery and Posterior Tibial artery pedal pulses are 2/4 bilateral with immedate capillary fill time. Pedal hair growth present. No varicosities and no lower extremity edema present bilateral. There is no pain with calf compression, swelling, warmth, erythema.   Neruologic: Grossly intact via light touch bilateral. Vibratory intact via tuning fork bilateral. Protective threshold with Semmes Wienstein monofilament intact to all pedal sites bilateral. Patellar and Achilles deep tendon reflexes 2+ bilateral. No Babinski or clonus noted bilateral. Negative tinel sign  Musculoskeletal: There is a decrease in medial arch height upon weightbearing. There is discomfort on the lateral aspect of the foot on the sinus tarsi bilaterally. There is no pain or  restriction with subtalar joint range of motion. There doesn't be some mild edema along the lateral aspect of the foot and posterior lateral malleolus. There is no pain along the peroneal tendons however there is swelling present. No other areas of tenderness and no area of pinpoint bony tenderness and no pain to vibratory sensation. Mild equinus. MMT 5/5.   Gait: Unassisted, Nonantalgic.      Assessment & Plan:  35 year old female with bilateral foot swelling with intermittent discomfort likely multifactorial due to biomechanical changes, capsulitis; possibly swelling due to new medication. -Treatment options discussed including all alternatives, risks, and complications -Etiology of symptoms were discussed -X-rays were obtained and reviewed with the patient. No evidence of acute fracture or stress fracture. -Discussed steroid injection. Understood risks and complications she elected to proceed. Under sterile conditions a mixture of dexamethasone phosphate and local anesthetic was infiltrated in the lateral aspect of the foot on the sinus tarsi bilaterally without, occasions. Post injection care discussed. -Bilateral compression anklets dispensed.  -Dicussed orthotics.  -Prescribed mobic. Discussed side effects of the medication and directed to stop if any are to occur and call the office.  -Follow-up in 3 weeks or sooner if any problems arise. In the meantime, encouraged to call the office with any questions, concerns, change in symptoms.   Ovid CurdMatthew Wagoner, DPM

## 2015-07-02 ENCOUNTER — Telehealth: Payer: Self-pay | Admitting: *Deleted

## 2015-07-02 NOTE — Telephone Encounter (Signed)
Pt states she is still having a lot of burning pain and wanted to know if it would help for her to be out of work and be seen earlier than 3 weeks from now.  I offered pt an appt 07/06/2015 and she asked if being out of work until the appt may help.  Note written to be out of work until evaluated again on 07/06/2015, by Dr. Ardelle AntonWagoner.

## 2015-07-10 ENCOUNTER — Ambulatory Visit: Payer: Federal, State, Local not specified - PPO | Admitting: Podiatry

## 2015-07-22 ENCOUNTER — Ambulatory Visit (INDEPENDENT_AMBULATORY_CARE_PROVIDER_SITE_OTHER): Payer: Federal, State, Local not specified - PPO | Admitting: Family Medicine

## 2015-07-22 VITALS — BP 122/80 | HR 67 | Temp 98.2°F | Resp 16 | Ht 63.0 in | Wt 182.0 lb

## 2015-07-22 DIAGNOSIS — J029 Acute pharyngitis, unspecified: Secondary | ICD-10-CM | POA: Diagnosis not present

## 2015-07-22 LAB — POCT RAPID STREP A (OFFICE): Rapid Strep A Screen: NEGATIVE

## 2015-07-22 NOTE — Progress Notes (Addendum)
Pt By signing my name below I, Shelah Lewandowsky, attest that this documentation has been prepared under the direction and in the presence of Shade Flood, MD. Electonically Signed. Shelah Lewandowsky, Scribe 07/22/2015 at 1:20 PM  Subjective:    Patient ID: Amy Castro, female    DOB: Jul 12, 1980, 35 y.o.   MRN: [id number]  Chief Complaint  Patient presents with  . Sore Throat    x 1 day, pt says see white in back of throat   . Immunizations    tdap    HPI Amy Castro is a 36 y.o. female who presents to the [facility] complaining of sore throat that started yesterday. Pt denies any known sick contacts, fevers, abd pain, nasal congestion. Pt reports cough.   Pt is overdue for her tetanus shot.   Pt has 4 children at home, all are healthy and do not have any similar symptoms.   There are no active problems to display for this patient.  Past Medical History  Diagnosis Date  . Ruptured cyst of ovary right  . Frequent headaches    Past Surgical History  Procedure Laterality Date  . Cesarean section    . Tubal ligation     Allergies  Allergen Reactions  . Zantac [Ranitidine Hcl] Hives   Prior to Admission medications   Medication Sig Start Date End Date Taking? Authorizing Provider  meloxicam (MOBIC) 15 MG tablet Take 1 tablet (15 mg total) by mouth daily. 06/29/15  Yes Vivi Barrack, DPM  norethindrone-ethinyl estradiol (JUNEL FE,GILDESS FE,LOESTRIN FE) 1-20 MG-MCG tablet Take 1 tablet by mouth daily.   Yes Historical Provider, MD  SUMAtriptan (IMITREX) 25 MG tablet Take 25 mg by mouth every 2 (two) hours as needed for migraine. May repeat in 2 hours if headache persists or recurs.   Yes Historical Provider, MD  topiramate (TOPAMAX) 25 MG tablet Take 25 mg by mouth 2 (two) times daily.   Yes Historical Provider, MD  traMADol (ULTRAM) 50 MG tablet Take 1 tablet (50 mg total) by mouth every 6 (six) hours as needed. 09/17/13  Yes Terri Piedra, PA-C    Social History   Social History  . Marital Status: Married    Spouse Name: N/A  . Number of Children: N/A  . Years of Education: N/A   Occupational History  . Not on file.   Social History Main Topics  . Smoking status: Never Smoker   . Smokeless tobacco: Never Used  . Alcohol Use: No  . Drug Use: No  . Sexual Activity: Not on file   Other Topics Concern  . Not on file   Social History Narrative      Review of Systems  Constitutional: Negative for fever.  HENT: Positive for sore throat. Negative for congestion.   Respiratory: Positive for cough.   Gastrointestinal: Negative for abdominal pain.       Objective:   Physical Exam  Constitutional: She is oriented to person, place, and time. She appears well-developed and well-nourished. No distress.  HENT:  Head: Normocephalic and atraumatic.  Right Ear: Hearing, tympanic membrane, external ear and ear canal normal.  Left Ear: Hearing, tympanic membrane, external ear and ear canal normal.  Nose: Nose normal.  Mouth/Throat: Oropharynx is clear and moist. No oropharyngeal exudate or posterior oropharyngeal erythema.  Few tonsilliths on rt and left, no exudate. Prominent tonsils.   Eyes: Conjunctivae and EOM are normal. Pupils are equal, round, and reactive to light.  Neck: No  thyromegaly present.  Cardiovascular: Normal rate, regular rhythm, normal heart sounds and intact distal pulses.   No murmur heard. Pulmonary/Chest: Effort normal and breath sounds normal. No respiratory distress. She has no decreased breath sounds. She has no wheezes. She has no rhonchi. She has no rales.  Abdominal: Normal appearance and bowel sounds are normal. There is no tenderness. There is no rebound and no guarding.  Lymphadenopathy:    She has no cervical adenopathy.  Neurological: She is alert and oriented to person, place, and time.  Skin: Skin is warm and dry. No rash noted.  Psychiatric: She has a normal mood and affect. Her  behavior is normal.  Vitals reviewed.   Filed Vitals:   07/22/15 1232  BP: 122/80  Pulse: 67  Temp: 98.2 F (36.8 C)  TempSrc: Oral  Resp: 16  Height:  (1.6 m)  Weight: 182 lb (82.555 kg)  SpO2: 100%    Results for orders placed or performed in visit on 07/22/15  POCT rapid strep A  Result Value Ref Range   Rapid Strep A Screen Negative Negative        Assessment & Plan:   Amy Castro is a 35 y.o. female Sore throat - Plan: Culture, Group A Strep, POCT rapid strep A Suspected early respiratory infection, viral. Throat culture pending. Symptomatic care discussed, RTC precautions.   Patient left prior Tdap. Called her and advised to have this done at next available office visit or physical.  No orders of the defined types were placed in this encounter.   Patient Instructions       IF you received an x-ray today, you will receive an invoice from [facility]. Please contact [facility] at [phone] with questions or concerns regarding your invoice.   IF you received labwork today, you will receive an invoice from [employer]. Please contact [facility] at [phone] with questions or concerns regarding your invoice.   Our billing staff will not be able to assist you with questions regarding bills from these companies.  You will be contacted with the lab results as soon as they are available. The fastest way to get your results is to activate your My Chart account. Instructions are located on the last page of this paperwork. If you have not heard from [place] regarding the results in 2 weeks, please contact this office.    Your strep test was negative. I will check a throat culture, but currently this appears to be due to a virus. Cepacol or other cough drops as we discussed, Tylenol or Motrin as needed for pain. Drink plenty of fluids and rest as needed.    Return to the clinic or go to the nearest emergency room if  any of your symptoms worsen or new symptoms occur.   Sore Throat A sore throat is pain, burning, irritation, or scratchiness of the throat. There is often pain or tenderness when swallowing or talking. A sore throat may be accompanied by other symptoms, such as coughing, sneezing, fever, and swollen neck glands. A sore throat is often the first sign of another sickness, such as a cold, flu, strep throat, or mononucleosis (commonly known as mono). Most sore throats go away without medical treatment. CAUSES  The most common causes of a sore throat include:  A viral infection, such as a cold, flu, or mono.  A bacterial infection, such as strep throat, tonsillitis, or whooping cough.  Seasonal allergies.  Dryness in the air.  Irritants, such  as smoke or pollution.  Gastroesophageal reflux disease (GERD). HOME CARE INSTRUCTIONS   Only take over-the-counter medicines as directed by your caregiver.  Drink enough fluids to keep your urine clear or pale yellow.  Rest as needed.  Try using throat sprays, lozenges, or sucking on hard candy to ease any pain (if older than 4 years or as directed).  Sip warm liquids, such as broth, herbal tea, or warm water with honey to relieve pain temporarily. You may also eat or drink cold or frozen liquids such as frozen ice pops.  Gargle with salt water (mix 1 tsp salt with 8 oz of water).  Do not smoke and avoid secondhand smoke.  Put a cool-mist humidifier in your bedroom at night to moisten the air. You can also turn on a hot shower and sit in the bathroom with the door closed for 5-10 minutes. SEEK IMMEDIATE MEDICAL CARE IF:  You have difficulty breathing.  You are unable to swallow fluids, soft foods, or your saliva.  You have increased swelling in the throat.  Your sore throat does not get better in 7 days.  You have nausea and vomiting.  You have a fever or persistent symptoms for more than 2-3 days.  You have a fever and your  symptoms suddenly get worse. MAKE SURE YOU:   Understand these instructions.  Will watch your condition.  Will get help right away if you are not doing well or get worse.   This information is not intended to replace advice given to you by your health care provider. Make sure you discuss any questions you have with your health care provider.   Document Released: 03/10/2004 Document Revised: 02/21/2014 Document Reviewed: 10/09/2011 Elsevier Interactive Patient Education [employer] Elsevier Inc.     I personally performed the services described in this documentation, which was scribed in my presence. The recorded information has been reviewed and considered, and addended by me as needed.   Signed,   Meredith StaggersJeffrey Mihail Prettyman, MD [facility].  07/22/2015 1:46 PM

## 2015-07-22 NOTE — Patient Instructions (Addendum)
IF you received an x-ray today, you will receive an invoice from [facility]. Please contact [facility] at [phone] with questions or concerns regarding your invoice.   IF you received labwork today, you will receive an invoice from [employer]. Please contact [facility] at [phone] with questions or concerns regarding your invoice.   Our billing staff will not be able to assist you with questions regarding bills from these companies.  You will be contacted with the lab results as soon as they are available. The fastest way to get your results is to activate your My Chart account. Instructions are located on the last page of this paperwork. If you have not heard from [place] regarding the results in 2 weeks, please contact this office.    Your strep test was negative. I will check a throat culture, but currently this appears to be due to a virus. Cepacol or other cough drops as we discussed, Tylenol or Motrin as needed for pain. Drink plenty of fluids and rest as needed.    Return to the clinic or go to the nearest emergency room if any of your symptoms worsen or new symptoms occur.   Sore Throat A sore throat is pain, burning, irritation, or scratchiness of the throat. There is often pain or tenderness when swallowing or talking. A sore throat may be accompanied by other symptoms, such as coughing, sneezing, fever, and swollen neck glands. A sore throat is often the first sign of another sickness, such as a cold, flu, strep throat, or mononucleosis (commonly known as mono). Most sore throats go away without medical treatment. CAUSES  The most common causes of a sore throat include:  A viral infection, such as a cold, flu, or mono.  A bacterial infection, such as strep throat, tonsillitis, or whooping cough.  Seasonal allergies.  Dryness in the air.  Irritants, such as smoke or pollution.  Gastroesophageal reflux disease  (GERD). HOME CARE INSTRUCTIONS   Only take over-the-counter medicines as directed by your caregiver.  Drink enough fluids to keep your urine clear or pale yellow.  Rest as needed.  Try using throat sprays, lozenges, or sucking on hard candy to ease any pain (if older than 4 years or as directed).  Sip warm liquids, such as broth, herbal tea, or warm water with honey to relieve pain temporarily. You may also eat or drink cold or frozen liquids such as frozen ice pops.  Gargle with salt water (mix 1 tsp salt with 8 oz of water).  Do not smoke and avoid secondhand smoke.  Put a cool-mist humidifier in your bedroom at night to moisten the air. You can also turn on a hot shower and sit in the bathroom with the door closed for 5-10 minutes. SEEK IMMEDIATE MEDICAL CARE IF:  You have difficulty breathing.  You are unable to swallow fluids, soft foods, or your saliva.  You have increased swelling in the throat.  Your sore throat does not get better in 7 days.  You have nausea and vomiting.  You have a fever or persistent symptoms for more than 2-3 days.  You have a fever and your symptoms suddenly get worse. MAKE SURE YOU:   Understand these instructions.  Will watch your condition.  Will get help right away if you are not doing well or get worse.   This information is not intended to replace advice given to you by your health care provider. Make sure you discuss any  questions you have with your health care provider.   Document Released: 03/10/2004 Document Revised: 02/21/2014 Document Reviewed: 10/09/2011 Elsevier Interactive Patient Education [employer] Elsevier Inc.

## 2015-07-24 LAB — CULTURE, GROUP A STREP: Organism ID, Bacteria: NORMAL

## 2015-07-27 ENCOUNTER — Encounter: Payer: Self-pay | Admitting: Podiatry

## 2015-07-27 ENCOUNTER — Ambulatory Visit (INDEPENDENT_AMBULATORY_CARE_PROVIDER_SITE_OTHER): Payer: Federal, State, Local not specified - PPO | Admitting: Podiatry

## 2015-07-27 VITALS — BP 110/64 | HR 67 | Resp 18

## 2015-07-27 DIAGNOSIS — M25579 Pain in unspecified ankle and joints of unspecified foot: Secondary | ICD-10-CM

## 2015-07-27 DIAGNOSIS — M2140 Flat foot [pes planus] (acquired), unspecified foot: Secondary | ICD-10-CM

## 2015-07-27 DIAGNOSIS — G5750 Tarsal tunnel syndrome, unspecified lower limb: Secondary | ICD-10-CM

## 2015-07-27 DIAGNOSIS — M779 Enthesopathy, unspecified: Secondary | ICD-10-CM

## 2015-07-28 ENCOUNTER — Telehealth: Payer: Self-pay | Admitting: Emergency Medicine

## 2015-07-28 NOTE — Progress Notes (Signed)
Patient ID: [name], female   DOB: 1980/07/15, 35 y.o.   MRN: [id number]  Subjective: 35 year old female presents the office today for follow up evaluation of bilateral foot pain. She states that she is doing much better and the injections helped quite a bit. She gets some occasional pain however this greatly improved. She did try wearing power step inserts her she states they were too tight on top of her feet. No recent injury. The swelling is resolving. No erythema or warmth to the feet. Denies any systemic complaints such as fevers, chills, nausea, vomiting. No acute changes since last appointment, and no other complaints at this time.   Objective: AAO x3, NAD DP/PT pulses palpable bilaterally, CRT less than 3 seconds There is mild tenderness to palpation on the lateral aspect of the femoral and sinus tarsi bilaterally however this is much improved. There is a decrease in medial arch height upon weightbearing present. There is mild equinus. There is no other areas of tenderness bilaterally. No areas of pinpoint bony tenderness or pain with vibratory sensation. MMT 5/5, ROM WNL. No edema, erythema, increase in warmth to bilateral lower extremities.  No open lesions or pre-ulcerative lesions.  No pain with calf compression, swelling, warmth, erythema  Assessment: Resolving capsulitis bilateral feet due to biomechanical changes  Plan: -All treatment options discussed with the patient including all alternatives, risks, complications.  -I discussed with her inserts. Recommended her take out the insert that comes in her shoe and replace it with the orthotic to see if this helps. She will try this. -Discussed general stretching, rehabilitation exercises. -Anti-inflammatories as needed -Follow-up with symptoms not resolve the next 4 weeks or sooner if any issues are to arise. -Patient encouraged to call the office with any questions, concerns, change in symptoms.   Ovid CurdMatthew Nylene Inlow, DPM

## 2015-07-28 NOTE — Telephone Encounter (Signed)
Pt given negative lab results 

## 2015-07-28 NOTE — Telephone Encounter (Signed)
-----   Message from Shade FloodJeffrey R Greene, MD sent at 07/27/2015 10:58 AM EDT ----- Call patient - throat culture negative for strep throat bacteria.  Continue care as discussed at last office visit, return if any worsening.

## 2015-09-10 DIAGNOSIS — N939 Abnormal uterine and vaginal bleeding, unspecified: Secondary | ICD-10-CM | POA: Diagnosis not present

## 2015-09-30 DIAGNOSIS — N390 Urinary tract infection, site not specified: Secondary | ICD-10-CM | POA: Diagnosis not present

## 2015-12-25 ENCOUNTER — Other Ambulatory Visit: Payer: Self-pay | Admitting: Podiatry

## 2016-03-23 DIAGNOSIS — Z01419 Encounter for gynecological examination (general) (routine) without abnormal findings: Secondary | ICD-10-CM | POA: Diagnosis not present

## 2016-05-14 DIAGNOSIS — R300 Dysuria: Secondary | ICD-10-CM | POA: Diagnosis not present

## 2016-05-14 DIAGNOSIS — R102 Pelvic and perineal pain: Secondary | ICD-10-CM | POA: Diagnosis not present

## 2016-05-14 DIAGNOSIS — B9689 Other specified bacterial agents as the cause of diseases classified elsewhere: Secondary | ICD-10-CM | POA: Diagnosis not present

## 2016-05-14 DIAGNOSIS — N390 Urinary tract infection, site not specified: Secondary | ICD-10-CM | POA: Diagnosis not present

## 2016-05-14 DIAGNOSIS — Z888 Allergy status to other drugs, medicaments and biological substances status: Secondary | ICD-10-CM | POA: Diagnosis not present

## 2016-05-16 DIAGNOSIS — K649 Unspecified hemorrhoids: Secondary | ICD-10-CM | POA: Diagnosis not present

## 2016-05-16 DIAGNOSIS — K5900 Constipation, unspecified: Secondary | ICD-10-CM | POA: Diagnosis not present

## 2016-05-31 DIAGNOSIS — K5909 Other constipation: Secondary | ICD-10-CM | POA: Diagnosis not present

## 2016-05-31 DIAGNOSIS — K6289 Other specified diseases of anus and rectum: Secondary | ICD-10-CM | POA: Diagnosis not present

## 2016-05-31 DIAGNOSIS — K625 Hemorrhage of anus and rectum: Secondary | ICD-10-CM | POA: Diagnosis not present

## 2016-05-31 DIAGNOSIS — K648 Other hemorrhoids: Secondary | ICD-10-CM | POA: Diagnosis not present

## 2016-06-09 DIAGNOSIS — N939 Abnormal uterine and vaginal bleeding, unspecified: Secondary | ICD-10-CM | POA: Diagnosis not present

## 2016-08-10 DIAGNOSIS — Z6833 Body mass index (BMI) 33.0-33.9, adult: Secondary | ICD-10-CM | POA: Diagnosis not present

## 2016-08-10 DIAGNOSIS — R300 Dysuria: Secondary | ICD-10-CM | POA: Insufficient documentation

## 2016-08-19 DIAGNOSIS — N390 Urinary tract infection, site not specified: Secondary | ICD-10-CM | POA: Diagnosis not present

## 2016-08-19 DIAGNOSIS — G43109 Migraine with aura, not intractable, without status migrainosus: Secondary | ICD-10-CM | POA: Diagnosis not present

## 2016-11-09 DIAGNOSIS — Z23 Encounter for immunization: Secondary | ICD-10-CM | POA: Diagnosis not present

## 2016-11-09 DIAGNOSIS — D500 Iron deficiency anemia secondary to blood loss (chronic): Secondary | ICD-10-CM | POA: Diagnosis not present

## 2016-11-09 DIAGNOSIS — R5382 Chronic fatigue, unspecified: Secondary | ICD-10-CM | POA: Diagnosis not present

## 2017-02-22 DIAGNOSIS — K644 Residual hemorrhoidal skin tags: Secondary | ICD-10-CM | POA: Diagnosis not present

## 2017-02-22 DIAGNOSIS — K602 Anal fissure, unspecified: Secondary | ICD-10-CM | POA: Diagnosis not present

## 2017-04-14 DIAGNOSIS — R509 Fever, unspecified: Secondary | ICD-10-CM | POA: Diagnosis not present

## 2017-04-14 DIAGNOSIS — J1189 Influenza due to unidentified influenza virus with other manifestations: Secondary | ICD-10-CM | POA: Diagnosis not present

## 2017-05-10 DIAGNOSIS — K625 Hemorrhage of anus and rectum: Secondary | ICD-10-CM | POA: Diagnosis not present

## 2017-05-10 DIAGNOSIS — K644 Residual hemorrhoidal skin tags: Secondary | ICD-10-CM | POA: Diagnosis not present

## 2017-05-10 DIAGNOSIS — Z888 Allergy status to other drugs, medicaments and biological substances status: Secondary | ICD-10-CM | POA: Diagnosis not present

## 2017-05-10 DIAGNOSIS — K6289 Other specified diseases of anus and rectum: Secondary | ICD-10-CM | POA: Diagnosis not present

## 2017-05-10 DIAGNOSIS — K649 Unspecified hemorrhoids: Secondary | ICD-10-CM | POA: Diagnosis not present

## 2017-05-11 DIAGNOSIS — K648 Other hemorrhoids: Secondary | ICD-10-CM | POA: Diagnosis not present

## 2017-05-30 ENCOUNTER — Encounter: Payer: Self-pay | Admitting: Emergency Medicine

## 2017-05-30 ENCOUNTER — Emergency Department (INDEPENDENT_AMBULATORY_CARE_PROVIDER_SITE_OTHER): Payer: Federal, State, Local not specified - PPO

## 2017-05-30 ENCOUNTER — Emergency Department
Admission: EM | Admit: 2017-05-30 | Discharge: 2017-05-30 | Disposition: A | Discharge status: Home / Self Care | Payer: Federal, State, Local not specified - PPO | Source: Home / Self Care | Attending: Emergency Medicine | Admitting: Emergency Medicine

## 2017-05-30 ENCOUNTER — Other Ambulatory Visit: Payer: Self-pay

## 2017-05-30 DIAGNOSIS — R8290 Unspecified abnormal findings in urine: Secondary | ICD-10-CM

## 2017-05-30 DIAGNOSIS — R252 Cramp and spasm: Secondary | ICD-10-CM | POA: Diagnosis not present

## 2017-05-30 DIAGNOSIS — R102 Pelvic and perineal pain: Secondary | ICD-10-CM | POA: Diagnosis not present

## 2017-05-30 DIAGNOSIS — N912 Amenorrhea, unspecified: Secondary | ICD-10-CM | POA: Diagnosis not present

## 2017-05-30 LAB — POCT URINALYSIS DIP (MANUAL ENTRY)
Bilirubin, UA: NEGATIVE
Blood, UA: NEGATIVE
Glucose, UA: NEGATIVE mg/dL
Ketones, POC UA: NEGATIVE mg/dL
LEUKOCYTES UA: NEGATIVE
NITRITE UA: POSITIVE — AB
PROTEIN UA: NEGATIVE mg/dL
SPEC GRAV UA: 1.02 (ref 1.010–1.025)
UROBILINOGEN UA: 0.2 U/dL
pH, UA: 7 (ref 5.0–8.0)

## 2017-05-30 LAB — POCT URINE PREGNANCY: Preg Test, Ur: NEGATIVE

## 2017-05-30 MED ORDER — NITROFURANTOIN MONOHYD MACRO 100 MG PO CAPS: 100.0000 mg | ORAL_CAPSULE | Freq: Two times a day (BID) | ORAL | 0 refills | Status: DC

## 2017-05-30 NOTE — Discharge Instructions (Addendum)
Take antibiotics twice a day. Follow-up with your primary care physician if your menses does not begin in 2 weeks. We will call you with culture results. Take 1-2 Aleve twice a day.

## 2017-05-30 NOTE — ED Triage Notes (Signed)
Patient reports intermittent pelvic cramping; has had BTL and LMP 04/29/17.

## 2017-05-30 NOTE — ED Provider Notes (Signed)
[facility]    CSN: [id number] Arrival date & time: 05/30/17  1038     History   Chief Complaint Chief Complaint  Patient presents with  . Pelvic Pain    HPI Amy Castro is a 37 y.o. female.  Patient enters with a chief complaint of pelvic pain.  Her symptoms began last Thursday which is 5 days ago.  She has a crampy sensation in her lower abdomen as though she is going to start her menstrual period.  She is status post tubal ligation.  She has had one previous ruptured cyst.  She denies urinary symptoms of burning or stinging or pain on urination.  She has not had a discharge.  Pelvic Pain     Past Medical History:  Diagnosis Date  . Frequent headaches   . Ruptured cyst of ovary right    There are no active problems to display for this patient.   Past Surgical History:  Procedure Laterality Date  . CESAREAN SECTION    . TUBAL LIGATION      OB History   None      Home Medications    Prior to Admission medications   Medication Sig Start Date End Date Taking? Authorizing Provider  meloxicam (MOBIC) 15 MG tablet Take 1 tablet (15 mg total) by mouth daily. 06/29/15   Vivi BarrackWagoner, Matthew R, DPM  norethindrone-ethinyl estradiol (JUNEL FE,GILDESS FE,LOESTRIN FE) 1-20 MG-MCG tablet Take 1 tablet by mouth daily.    [provider]  SUMAtriptan (IMITREX) 25 MG tablet Take 25 mg by mouth every 2 (two) hours as needed for migraine. May repeat in 2 hours if headache persists or recurs.    [provider]  topiramate (TOPAMAX) 25 MG tablet Take 25 mg by mouth 2 (two) times daily.    [provider]  traMADol (ULTRAM) 50 MG tablet Take 1 tablet (50 mg total) by mouth every 6 (six) hours as needed. 09/17/13   Forcucci, Toni Amendourtney, PA-C    Family History Family History  Problem Relation Age of Onset  . Hypertension Sister   . Hypertension Brother   . Cancer Brother   . Diabetes Maternal Grandmother   . Hyperlipidemia Maternal  Grandmother   . Hypertension Maternal Grandmother   . Hypertension Sister     Social History Social History   Tobacco Use  . Smoking status: Never Smoker  . Smokeless tobacco: Never Used  Substance Use Topics  . Alcohol use: No    Alcohol/week: 0.0 oz  . Drug use: No     Allergies   Zantac [ranitidine hcl] and Zantac [ranitidine hcl]   Review of Systems Review of Systems  Constitutional: Negative.   Respiratory: Negative.   Cardiovascular: Negative.   Gastrointestinal: Negative.   Genitourinary: Positive for pelvic pain. Negative for flank pain, hematuria, urgency, vaginal discharge and vaginal pain.     Physical Exam Triage Vital Signs ED Triage Vitals  Enc Vitals Group     BP 05/30/17 1114 100/63     Pulse Rate 05/30/17 1114 71     Resp 05/30/17 1114 16     Temp 05/30/17 1114 98.4 F (36.9 C)     Temp Source 05/30/17 1114 Oral     SpO2 05/30/17 1114 99 %     Weight 05/30/17 1117 180 lb (81.6 kg)     Height 05/30/17 1117 5\' 2"  (1.575 m)     Head Circumference --      Peak Flow --  Pain Score 05/30/17 1117 5     Pain Loc --      Pain Edu? --      Excl. in GC? --    No data found.  Updated Vital Signs BP 100/63 (BP Location: Right Arm)   Pulse 71   Temp 98.4 F (36.9 C) (Oral)   Resp 16   Ht 5\' 2"  (1.575 m)   Wt 180 lb (81.6 kg)   LMP 04/29/2017 (Exact Date) Comment: bilateral tubal ligation  SpO2 99%   BMI 32.92 kg/m   Visual Acuity Right Eye Distance:   Left Eye Distance:   Bilateral Distance:    Right Eye Near:   Left Eye Near:    Bilateral Near:     Physical Exam  Constitutional: She appears well-developed and well-nourished.  Cardiovascular: Normal rate, regular rhythm and normal heart sounds.  Pulmonary/Chest: Effort normal and breath sounds normal.  Abdominal: Soft.  There is tenderness deep in the right lower abdomen and in the suprapubic area.     UC Treatments / Results  Labs (all labs ordered are listed, but only  abnormal results are displayed) Labs Reviewed  POCT URINALYSIS DIP (MANUAL ENTRY) - Abnormal; Notable for the following components:      Result Value   Color, UA light yellow (*)    Clarity, UA cloudy (*)    Nitrite, UA Positive (*)    All other components within normal limits  URINE CULTURE  POCT URINE PREGNANCY    EKG None Radiology US Pelvic Complete With Transvaginal  Result Date: 05/30/2017 CLINICAL DATA:  Cramping. EXAM: TRANSABDOMINAL AND TRANSVAGINAL ULTRASOUND OF PELVIS TECHNIQUE: Both transabdominal and transvaginal ultrasound examinations of the pelvis were performed. Transabdominal technique was performed for global imaging of the pelvis including uterus, ovaries, adnexal regions, and pelvic cul-de-sac. It was necessary to proceed with endovaginal exam following the transabdominal exam to visualize the uterus and ovaries. COMPARISON:  05/05/2014. FINDINGS: Uterus Measurements: 10.3 x 5.0 x 5.5 cm. No fibroids or other mass visualized. Endometrium Thickness: 1.3 mm.  No focal abnormality visualized. Right ovary Measurements: 2.5 x 1.8 x 2.3 cm. Normal appearance/no adnexal mass. Left ovary Measurements: 2.6 x 1.3 x 2.5 cm. Normal appearance/no adnexal mass. Other findings No abnormal free fluid. IMPRESSION: Negative exam. Electronically Signed   By: Maisie Fus  Register   On: 05/30/2017 12:37    Procedures Procedures (including critical care time)  Medications Ordered in UC Medications - No data to display   Initial Impression / Assessment and Plan / UC Course  I have reviewed the triage vital signs and the nursing notes.  Pertinent labs & imaging results that were available during my care of the patient were reviewed by me and considered in my medical decision making (see chart for details). Ultrasound did not reveal any abnormalities.  She did have positive nitrites on her urinalysis.  Will treat with Macrobid pending urine culture results.  She was advised.  If period does  not come in the next 2 weeks she will follow-up at [facility] primary care.      Final Clinical Impressions(s) / UC Diagnoses   Final diagnoses:  Pelvic pain    ED Discharge Orders    None     Take antibiotics twice a day. Follow-up with your primary care physician if your menses does not begin in 2 weeks. We will call you with culture results. Take 1-2 Aleve twice a day.  Controlled Substance Prescriptions [place] Controlled Substance Registry consulted? Not Applicable  Collene Gobble, MD 05/30/17 1248

## 2017-05-31 LAB — URINE CULTURE
MICRO NUMBER: 90466819
RESULT: NO GROWTH
SPECIMEN QUALITY: ADEQUATE

## 2017-06-01 ENCOUNTER — Telehealth: Payer: Self-pay

## 2017-06-01 NOTE — Telephone Encounter (Signed)
Pt is feeling better.  Lab results given, and will follow up as needed.

## 2017-06-12 DIAGNOSIS — K641 Second degree hemorrhoids: Secondary | ICD-10-CM | POA: Diagnosis not present

## 2017-06-26 DIAGNOSIS — K641 Second degree hemorrhoids: Secondary | ICD-10-CM | POA: Diagnosis not present

## 2017-12-19 DIAGNOSIS — Z23 Encounter for immunization: Secondary | ICD-10-CM | POA: Diagnosis not present

## 2017-12-19 DIAGNOSIS — G43109 Migraine with aura, not intractable, without status migrainosus: Secondary | ICD-10-CM | POA: Diagnosis not present

## 2018-04-02 DIAGNOSIS — R079 Chest pain, unspecified: Secondary | ICD-10-CM | POA: Diagnosis not present

## 2018-04-02 DIAGNOSIS — R0789 Other chest pain: Secondary | ICD-10-CM | POA: Diagnosis not present

## 2018-04-02 DIAGNOSIS — R001 Bradycardia, unspecified: Secondary | ICD-10-CM | POA: Diagnosis not present

## 2018-04-02 DIAGNOSIS — Z888 Allergy status to other drugs, medicaments and biological substances status: Secondary | ICD-10-CM | POA: Diagnosis not present

## 2018-06-03 DIAGNOSIS — L03818 Cellulitis of other sites: Secondary | ICD-10-CM | POA: Diagnosis not present

## 2018-08-31 DIAGNOSIS — R109 Unspecified abdominal pain: Secondary | ICD-10-CM | POA: Diagnosis not present

## 2018-08-31 DIAGNOSIS — Z6833 Body mass index (BMI) 33.0-33.9, adult: Secondary | ICD-10-CM | POA: Diagnosis not present

## 2018-09-25 DIAGNOSIS — K649 Unspecified hemorrhoids: Secondary | ICD-10-CM | POA: Diagnosis not present

## 2018-12-26 DIAGNOSIS — Z3202 Encounter for pregnancy test, result negative: Secondary | ICD-10-CM | POA: Diagnosis not present

## 2018-12-26 DIAGNOSIS — N939 Abnormal uterine and vaginal bleeding, unspecified: Secondary | ICD-10-CM | POA: Diagnosis not present

## 2019-01-08 DIAGNOSIS — Z01419 Encounter for gynecological examination (general) (routine) without abnormal findings: Secondary | ICD-10-CM | POA: Diagnosis not present

## 2019-01-08 DIAGNOSIS — N939 Abnormal uterine and vaginal bleeding, unspecified: Secondary | ICD-10-CM | POA: Diagnosis not present

## 2019-01-17 DIAGNOSIS — Z20828 Contact with and (suspected) exposure to other viral communicable diseases: Secondary | ICD-10-CM | POA: Diagnosis not present

## 2019-03-07 DIAGNOSIS — Z20822 Contact with and (suspected) exposure to covid-19: Secondary | ICD-10-CM | POA: Diagnosis not present

## 2019-04-01 DIAGNOSIS — K7689 Other specified diseases of liver: Secondary | ICD-10-CM | POA: Diagnosis not present

## 2019-04-01 DIAGNOSIS — Z888 Allergy status to other drugs, medicaments and biological substances status: Secondary | ICD-10-CM | POA: Diagnosis not present

## 2019-04-01 DIAGNOSIS — K5900 Constipation, unspecified: Secondary | ICD-10-CM | POA: Diagnosis not present

## 2019-04-01 DIAGNOSIS — R109 Unspecified abdominal pain: Secondary | ICD-10-CM | POA: Diagnosis not present

## 2019-04-01 DIAGNOSIS — R590 Localized enlarged lymph nodes: Secondary | ICD-10-CM | POA: Diagnosis not present

## 2019-04-01 DIAGNOSIS — K529 Noninfective gastroenteritis and colitis, unspecified: Secondary | ICD-10-CM | POA: Diagnosis not present

## 2019-04-01 DIAGNOSIS — R300 Dysuria: Secondary | ICD-10-CM | POA: Diagnosis not present

## 2019-04-01 DIAGNOSIS — K429 Umbilical hernia without obstruction or gangrene: Secondary | ICD-10-CM | POA: Diagnosis not present

## 2019-04-01 DIAGNOSIS — K769 Liver disease, unspecified: Secondary | ICD-10-CM | POA: Diagnosis not present

## 2019-04-01 DIAGNOSIS — K6289 Other specified diseases of anus and rectum: Secondary | ICD-10-CM | POA: Diagnosis not present

## 2019-04-03 DIAGNOSIS — R932 Abnormal findings on diagnostic imaging of liver and biliary tract: Secondary | ICD-10-CM | POA: Diagnosis not present

## 2019-04-09 ENCOUNTER — Other Ambulatory Visit: Payer: Self-pay | Admitting: Family Medicine

## 2019-04-11 ENCOUNTER — Other Ambulatory Visit: Payer: Self-pay | Admitting: Family Medicine

## 2019-04-11 DIAGNOSIS — R932 Abnormal findings on diagnostic imaging of liver and biliary tract: Secondary | ICD-10-CM

## 2019-05-04 ENCOUNTER — Other Ambulatory Visit: Payer: Self-pay

## 2019-05-04 ENCOUNTER — Ambulatory Visit
Admission: RE | Admit: 2019-05-04 | Discharge: 2019-05-04 | Disposition: A | Discharge status: Home / Self Care | Payer: Federal, State, Local not specified - PPO | Source: Ambulatory Visit | Attending: Family Medicine | Admitting: Family Medicine

## 2019-05-04 DIAGNOSIS — K769 Liver disease, unspecified: Secondary | ICD-10-CM | POA: Diagnosis not present

## 2019-05-04 DIAGNOSIS — D1803 Hemangioma of intra-abdominal structures: Secondary | ICD-10-CM | POA: Diagnosis not present

## 2019-05-04 DIAGNOSIS — R932 Abnormal findings on diagnostic imaging of liver and biliary tract: Secondary | ICD-10-CM

## 2019-05-04 MED ORDER — GADOBENATE DIMEGLUMINE 529 MG/ML IV SOLN
17.0000 mL | Freq: Once | INTRAVENOUS | Status: AC | PRN
  Administered 2019-05-04: 17 mL via INTRAVENOUS

## 2019-05-31 DIAGNOSIS — R05 Cough: Secondary | ICD-10-CM | POA: Diagnosis not present

## 2019-05-31 DIAGNOSIS — Z20828 Contact with and (suspected) exposure to other viral communicable diseases: Secondary | ICD-10-CM | POA: Diagnosis not present

## 2019-06-07 DIAGNOSIS — U071 COVID-19: Secondary | ICD-10-CM | POA: Diagnosis not present

## 2019-06-17 DIAGNOSIS — K006 Disturbances in tooth eruption: Secondary | ICD-10-CM | POA: Diagnosis not present

## 2019-06-28 DIAGNOSIS — N939 Abnormal uterine and vaginal bleeding, unspecified: Secondary | ICD-10-CM | POA: Diagnosis not present

## 2019-07-05 DIAGNOSIS — K029 Dental caries, unspecified: Secondary | ICD-10-CM | POA: Diagnosis not present

## 2019-10-31 ENCOUNTER — Ambulatory Visit
Admission: EM | Admit: 2019-10-31 | Discharge: 2019-10-31 | Disposition: A | Discharge status: Home / Self Care | Payer: Federal, State, Local not specified - PPO | Attending: Emergency Medicine | Admitting: Emergency Medicine

## 2019-10-31 ENCOUNTER — Other Ambulatory Visit: Payer: Self-pay

## 2019-10-31 DIAGNOSIS — Z1152 Encounter for screening for COVID-19: Secondary | ICD-10-CM

## 2019-10-31 NOTE — ED Triage Notes (Signed)
Pt states had a positive covid exposure last week. Denies sx's.

## 2019-10-31 NOTE — Discharge Instructions (Signed)

## 2019-11-04 LAB — NOVEL CORONAVIRUS, NAA: SARS-CoV-2, NAA: NOT DETECTED

## 2020-03-23 DIAGNOSIS — G43109 Migraine with aura, not intractable, without status migrainosus: Secondary | ICD-10-CM | POA: Diagnosis not present

## 2020-03-23 DIAGNOSIS — B354 Tinea corporis: Secondary | ICD-10-CM | POA: Diagnosis not present

## 2020-06-16 DIAGNOSIS — N920 Excessive and frequent menstruation with regular cycle: Secondary | ICD-10-CM | POA: Diagnosis not present

## 2020-06-16 DIAGNOSIS — Z01419 Encounter for gynecological examination (general) (routine) without abnormal findings: Secondary | ICD-10-CM | POA: Diagnosis not present

## 2020-06-16 DIAGNOSIS — N852 Hypertrophy of uterus: Secondary | ICD-10-CM | POA: Diagnosis not present

## 2020-06-30 DIAGNOSIS — N920 Excessive and frequent menstruation with regular cycle: Secondary | ICD-10-CM | POA: Diagnosis not present

## 2020-06-30 DIAGNOSIS — N852 Hypertrophy of uterus: Secondary | ICD-10-CM | POA: Diagnosis not present

## 2020-07-07 DIAGNOSIS — R197 Diarrhea, unspecified: Secondary | ICD-10-CM | POA: Diagnosis not present

## 2020-07-07 DIAGNOSIS — N930 Postcoital and contact bleeding: Secondary | ICD-10-CM | POA: Diagnosis not present

## 2020-07-07 DIAGNOSIS — R102 Pelvic and perineal pain: Secondary | ICD-10-CM | POA: Diagnosis not present

## 2020-09-01 DIAGNOSIS — L282 Other prurigo: Secondary | ICD-10-CM | POA: Diagnosis not present

## 2020-09-01 DIAGNOSIS — L210 Seborrhea capitis: Secondary | ICD-10-CM | POA: Diagnosis not present

## 2020-10-27 DIAGNOSIS — R6889 Other general symptoms and signs: Secondary | ICD-10-CM | POA: Diagnosis not present

## 2020-10-27 DIAGNOSIS — N920 Excessive and frequent menstruation with regular cycle: Secondary | ICD-10-CM | POA: Diagnosis not present

## 2020-11-18 ENCOUNTER — Other Ambulatory Visit: Payer: Self-pay | Admitting: Nurse Practitioner

## 2020-11-18 DIAGNOSIS — Z1231 Encounter for screening mammogram for malignant neoplasm of breast: Secondary | ICD-10-CM

## 2020-12-14 ENCOUNTER — Ambulatory Visit: Payer: Federal, State, Local not specified - PPO

## 2020-12-15 DIAGNOSIS — J309 Allergic rhinitis, unspecified: Secondary | ICD-10-CM | POA: Diagnosis not present

## 2020-12-15 DIAGNOSIS — Z20822 Contact with and (suspected) exposure to covid-19: Secondary | ICD-10-CM | POA: Diagnosis not present

## 2020-12-15 DIAGNOSIS — J00 Acute nasopharyngitis [common cold]: Secondary | ICD-10-CM | POA: Diagnosis not present

## 2020-12-17 DIAGNOSIS — H5789 Other specified disorders of eye and adnexa: Secondary | ICD-10-CM | POA: Diagnosis not present

## 2020-12-17 DIAGNOSIS — J029 Acute pharyngitis, unspecified: Secondary | ICD-10-CM | POA: Diagnosis not present

## 2020-12-17 DIAGNOSIS — Z888 Allergy status to other drugs, medicaments and biological substances status: Secondary | ICD-10-CM | POA: Diagnosis not present

## 2020-12-17 DIAGNOSIS — R519 Headache, unspecified: Secondary | ICD-10-CM | POA: Diagnosis not present

## 2020-12-17 DIAGNOSIS — K219 Gastro-esophageal reflux disease without esophagitis: Secondary | ICD-10-CM | POA: Diagnosis not present

## 2020-12-17 DIAGNOSIS — U071 COVID-19: Secondary | ICD-10-CM | POA: Diagnosis not present

## 2020-12-17 DIAGNOSIS — R0981 Nasal congestion: Secondary | ICD-10-CM | POA: Diagnosis not present

## 2020-12-17 DIAGNOSIS — Z791 Long term (current) use of non-steroidal anti-inflammatories (NSAID): Secondary | ICD-10-CM | POA: Diagnosis not present

## 2020-12-18 DIAGNOSIS — Z888 Allergy status to other drugs, medicaments and biological substances status: Secondary | ICD-10-CM | POA: Diagnosis not present

## 2020-12-18 DIAGNOSIS — U071 COVID-19: Secondary | ICD-10-CM | POA: Diagnosis not present

## 2020-12-18 DIAGNOSIS — R059 Cough, unspecified: Secondary | ICD-10-CM | POA: Diagnosis not present

## 2020-12-18 DIAGNOSIS — R519 Headache, unspecified: Secondary | ICD-10-CM | POA: Diagnosis not present

## 2020-12-18 DIAGNOSIS — K219 Gastro-esophageal reflux disease without esophagitis: Secondary | ICD-10-CM | POA: Diagnosis not present

## 2020-12-18 DIAGNOSIS — Z79899 Other long term (current) drug therapy: Secondary | ICD-10-CM | POA: Diagnosis not present

## 2020-12-18 DIAGNOSIS — J029 Acute pharyngitis, unspecified: Secondary | ICD-10-CM | POA: Diagnosis not present

## 2021-01-11 DIAGNOSIS — Z888 Allergy status to other drugs, medicaments and biological substances status: Secondary | ICD-10-CM | POA: Diagnosis not present

## 2021-01-11 DIAGNOSIS — K5900 Constipation, unspecified: Secondary | ICD-10-CM | POA: Diagnosis not present

## 2021-01-11 DIAGNOSIS — Z791 Long term (current) use of non-steroidal anti-inflammatories (NSAID): Secondary | ICD-10-CM | POA: Diagnosis not present

## 2021-01-11 DIAGNOSIS — K6289 Other specified diseases of anus and rectum: Secondary | ICD-10-CM | POA: Diagnosis not present

## 2021-01-11 DIAGNOSIS — K5909 Other constipation: Secondary | ICD-10-CM | POA: Diagnosis not present

## 2021-01-11 DIAGNOSIS — K6389 Other specified diseases of intestine: Secondary | ICD-10-CM | POA: Diagnosis not present

## 2021-02-10 ENCOUNTER — Other Ambulatory Visit: Payer: Self-pay

## 2021-02-10 ENCOUNTER — Ambulatory Visit
Admission: RE | Admit: 2021-02-10 | Discharge: 2021-02-10 | Disposition: A | Discharge status: Home / Self Care | Payer: Federal, State, Local not specified - PPO | Source: Ambulatory Visit | Attending: Nurse Practitioner | Admitting: Nurse Practitioner

## 2021-02-10 DIAGNOSIS — Z1231 Encounter for screening mammogram for malignant neoplasm of breast: Secondary | ICD-10-CM

## 2021-02-12 ENCOUNTER — Other Ambulatory Visit: Payer: Self-pay | Admitting: Nurse Practitioner

## 2021-02-12 DIAGNOSIS — R928 Other abnormal and inconclusive findings on diagnostic imaging of breast: Secondary | ICD-10-CM

## 2021-03-16 ENCOUNTER — Other Ambulatory Visit: Payer: Self-pay | Admitting: Nurse Practitioner

## 2021-03-16 ENCOUNTER — Ambulatory Visit
Admission: RE | Admit: 2021-03-16 | Discharge: 2021-03-16 | Disposition: A | Discharge status: Home / Self Care | Payer: Federal, State, Local not specified - PPO | Source: Ambulatory Visit | Attending: Nurse Practitioner | Admitting: Nurse Practitioner

## 2021-03-16 DIAGNOSIS — R922 Inconclusive mammogram: Secondary | ICD-10-CM | POA: Diagnosis not present

## 2021-03-16 DIAGNOSIS — R928 Other abnormal and inconclusive findings on diagnostic imaging of breast: Secondary | ICD-10-CM

## 2021-03-16 DIAGNOSIS — N6489 Other specified disorders of breast: Secondary | ICD-10-CM

## 2021-03-23 ENCOUNTER — Other Ambulatory Visit: Payer: Federal, State, Local not specified - PPO

## 2021-08-18 ENCOUNTER — Other Ambulatory Visit: Payer: Federal, State, Local not specified - PPO

## 2021-09-14 ENCOUNTER — Ambulatory Visit
Admission: RE | Admit: 2021-09-14 | Discharge: 2021-09-14 | Disposition: A | Discharge status: Home / Self Care | Payer: Federal, State, Local not specified - PPO | Source: Ambulatory Visit | Attending: Nurse Practitioner | Admitting: Nurse Practitioner

## 2021-09-14 ENCOUNTER — Other Ambulatory Visit: Payer: Self-pay | Admitting: Nurse Practitioner

## 2021-09-14 DIAGNOSIS — N6320 Unspecified lump in the left breast, unspecified quadrant: Secondary | ICD-10-CM

## 2021-09-14 DIAGNOSIS — N6324 Unspecified lump in the left breast, lower inner quadrant: Secondary | ICD-10-CM | POA: Diagnosis not present

## 2021-09-14 DIAGNOSIS — N6489 Other specified disorders of breast: Secondary | ICD-10-CM

## 2021-09-14 DIAGNOSIS — N6323 Unspecified lump in the left breast, lower outer quadrant: Secondary | ICD-10-CM | POA: Diagnosis not present

## 2021-09-14 DIAGNOSIS — R922 Inconclusive mammogram: Secondary | ICD-10-CM | POA: Diagnosis not present

## 2021-11-09 DIAGNOSIS — J069 Acute upper respiratory infection, unspecified: Secondary | ICD-10-CM | POA: Diagnosis not present

## 2021-12-21 DIAGNOSIS — Z6834 Body mass index (BMI) 34.0-34.9, adult: Secondary | ICD-10-CM | POA: Diagnosis not present

## 2021-12-21 DIAGNOSIS — Z3040 Encounter for surveillance of contraceptives, unspecified: Secondary | ICD-10-CM | POA: Diagnosis not present

## 2021-12-21 DIAGNOSIS — Z1389 Encounter for screening for other disorder: Secondary | ICD-10-CM | POA: Diagnosis not present

## 2021-12-21 DIAGNOSIS — Z01419 Encounter for gynecological examination (general) (routine) without abnormal findings: Secondary | ICD-10-CM | POA: Diagnosis not present

## 2022-01-11 DIAGNOSIS — M62830 Muscle spasm of back: Secondary | ICD-10-CM | POA: Insufficient documentation

## 2022-01-31 DIAGNOSIS — K649 Unspecified hemorrhoids: Secondary | ICD-10-CM | POA: Insufficient documentation

## 2022-02-01 DIAGNOSIS — Z888 Allergy status to other drugs, medicaments and biological substances status: Secondary | ICD-10-CM | POA: Diagnosis not present

## 2022-02-01 DIAGNOSIS — K625 Hemorrhage of anus and rectum: Secondary | ICD-10-CM | POA: Diagnosis not present

## 2022-02-01 DIAGNOSIS — R030 Elevated blood-pressure reading, without diagnosis of hypertension: Secondary | ICD-10-CM | POA: Diagnosis not present

## 2022-02-01 DIAGNOSIS — K644 Residual hemorrhoidal skin tags: Secondary | ICD-10-CM | POA: Diagnosis not present

## 2022-03-02 ENCOUNTER — Ambulatory Visit
Admission: RE | Admit: 2022-03-02 | Discharge: 2022-03-02 | Disposition: A | Discharge status: Home / Self Care | Payer: Federal, State, Local not specified - PPO | Source: Ambulatory Visit | Attending: Nurse Practitioner | Admitting: Nurse Practitioner

## 2022-03-02 DIAGNOSIS — N6320 Unspecified lump in the left breast, unspecified quadrant: Secondary | ICD-10-CM

## 2022-07-04 DIAGNOSIS — M79645 Pain in left finger(s): Secondary | ICD-10-CM | POA: Insufficient documentation

## 2022-07-04 DIAGNOSIS — S62641A Nondisplaced fracture of proximal phalanx of left index finger, initial encounter for closed fracture: Secondary | ICD-10-CM | POA: Insufficient documentation

## 2022-07-19 DIAGNOSIS — N921 Excessive and frequent menstruation with irregular cycle: Secondary | ICD-10-CM | POA: Insufficient documentation

## 2023-09-12 ENCOUNTER — Ambulatory Visit
Admission: EM | Admit: 2023-09-12 | Discharge: 2023-09-12 | Disposition: A | Discharge status: Home / Self Care | Attending: Family Medicine | Admitting: Family Medicine

## 2023-09-12 DIAGNOSIS — R300 Dysuria: Secondary | ICD-10-CM | POA: Insufficient documentation

## 2023-09-12 LAB — POCT URINE DIPSTICK
Bilirubin, UA: NEGATIVE
Glucose, UA: NEGATIVE mg/dL
Ketones, POC UA: NEGATIVE mg/dL
Nitrite, UA: NEGATIVE
POC PROTEIN,UA: NEGATIVE
Spec Grav, UA: 1.02 (ref 1.010–1.025)
Urobilinogen, UA: 0.2 U/dL
pH, UA: 7 (ref 5.0–8.0)

## 2023-09-12 MED ORDER — PHENAZOPYRIDINE HCL 100 MG PO TABS: 100.0000 mg | ORAL_TABLET | Freq: Three times a day (TID) | ORAL | 0 refills | Status: AC | PRN

## 2023-09-12 MED ORDER — LEVOFLOXACIN 500 MG PO TABS: 500.0000 mg | ORAL_TABLET | Freq: Every day | ORAL | 0 refills | Status: AC

## 2023-09-12 MED ORDER — FLUCONAZOLE 150 MG PO TABS: 150.0000 mg | ORAL_TABLET | ORAL | 0 refills | Status: AC

## 2023-09-12 NOTE — ED Triage Notes (Signed)
 It started last week with hurting when I pee and after. I did get a Rx last week that I finished (Cephalexin 500, for 5 days), I did a Telehealth visit and rec'd that but the symptoms remain. No fever known.

## 2023-09-12 NOTE — Discharge Instructions (Addendum)
 The urinalysis showed a small amount of white blood cells and a little bit of red blood cells.  This could be consistent with a bladder infection.  Levaquin  500 mg--take 1 tablet daily for 7 days  Urine culture is sent, and staff will notify you that it looks like the antibiotic needs to be changed.  Take Pyridium /phenazopyridine  100 mg--1 tablet 3 times daily as needed for urinary pain.  This medication usually makes the urine orange  Take fluconazole  150 mg--1 tablet every 3 days for 2 doses; take this medication if you start having symptoms of a yeast infection.  Staff will notify you if there is anything positive on the swab (or on the blood work if that has been taken at this visit). It can take 2-3 days for the tests to result, depending on the day of the week your test was taken. You will only be notified if there are any positives on the testing; test results will also go to your MyChart if you are signed up for MyChart.

## 2023-09-12 NOTE — ED Provider Notes (Signed)
 [facility]    CSN: [id number] Arrival date & time: 09/12/23  0808      History   Chief Complaint Chief Complaint  Patient presents with   UTI Symptoms    HPI Amy Castro is a 43 y.o. female.   HPI Here for dysuria and urinary frequency.  It has been going on for about a week or a little bit more.  No fever or chills no nausea or vomiting.  No vaginal discharge or itching.  Last menstrual cycle was July 21  She is allergic to ranitidine  She did a telehealth visit and was prescribed cephalexin.  She finished it on July 26.  While taking the antibiotic she did improve some but the symptoms are persistent and a little bit worse again. Past Medical History:  Diagnosis Date   Frequent headaches    Ruptured cyst of ovary right    Patient Active Problem List   Diagnosis Date Noted   Irregular intermenstrual bleeding 07/19/2022   Finger pain, left 07/04/2022   Nondisplaced fracture of proximal phalanx of left index finger, initial encounter for closed fracture 07/04/2022   Hemorrhoids 01/31/2022   Spasm of thoracic back muscle 01/11/2022   Dysuria 08/10/2016    Past Surgical History:  Procedure Laterality Date   CESAREAN SECTION     TUBAL LIGATION      OB History   No obstetric history on file.      Home Medications    Prior to Admission medications   Medication Sig Start Date End Date Taking? Authorizing Provider  clobetasol (TEMOVATE) 0.05 % external solution Apply 1 Application topically 2 (two) times daily. 05/30/23  Yes [provider]  clotrimazole-betamethasone (LOTRISONE) cream Apply 1 Application topically 2 (two) times daily. 05/30/23  Yes [provider]  fluconazole  (DIFLUCAN ) 150 MG tablet Take 1 tablet (150 mg total) by mouth every 3 (three) days for 2 doses. 09/12/23 09/16/23 Yes Temeca Somma K, MD  hydrocortisone 1 % ointment Apply 1 Application topically 2 (two) times daily. 05/15/23  Yes [provider]  ketoconazole (NIZORAL) 2 % shampoo Apply 1 Application topically as directed. 06/24/23  Yes [provider]  levofloxacin  (LEVAQUIN ) 500 MG tablet Take 1 tablet (500 mg total) by mouth daily for 7 days. 09/12/23 09/19/23 Yes Arhan Mcmanamon K, MD  naproxen sodium (ANAPROX) 550 MG tablet Take 550 mg by mouth 2 (two) times daily with a meal. 04/02/18  Yes [provider]  norethindrone (MICRONOR) 0.35 MG tablet Take 1 tablet by mouth daily. 08/01/23  Yes [provider]  phenazopyridine  (PYRIDIUM ) 100 MG tablet Take 1 tablet (100 mg total) by mouth 3 (three) times daily as needed (urinary pain). 09/12/23  Yes Jerrilynn Mikowski K, MD  Cholecalciferol 1.25 MG (50000 UT) capsule Take 50,000 Units by mouth once a week.    [provider]  meloxicam  (MOBIC ) 15 MG tablet Take 1 tablet (15 mg total) by mouth daily. 06/29/15   Gershon Donnice SAUNDERS, DPM  norethindrone (GALLIFREY) 5 MG tablet Take 15 mg by mouth as directed.    [provider]  norethindrone-ethinyl estradiol (JUNEL FE,GILDESS FE,LOESTRIN FE) 1-20 MG-MCG tablet Take 1 tablet by mouth daily.    [provider]  SUMAtriptan (IMITREX) 25 MG tablet Take 25 mg by mouth every 2 (two) hours as needed for migraine. May repeat in 2 hours if headache persists or recurs.    [provider]  topiramate (TOPAMAX) 25 MG tablet Take 25 mg  by mouth 2 (two) times daily.    [provider]    Family History Family History  Problem Relation Age of Onset   Hypertension Sister    Hypertension Sister    Breast cancer Maternal Grandmother    Diabetes Maternal Grandmother    Hyperlipidemia Maternal Grandmother    Hypertension Maternal Grandmother    Hypertension Brother    Cancer Brother     Social History Social History   Tobacco Use   Smoking status: Never   Smokeless tobacco: Never  Vaping Use   Vaping status: Never Used  Substance Use Topics   Alcohol use: Yes     Comment: Occassionally.   Drug use: No     Allergies   Ranitidine   Review of Systems Review of Systems   Physical Exam Triage Vital Signs ED Triage Vitals  Encounter Vitals Group     BP 09/12/23 0828 113/76     Girls Systolic BP Percentile --      Girls Diastolic BP Percentile --      Boys Systolic BP Percentile --      Boys Diastolic BP Percentile --      Pulse Rate 09/12/23 0828 (!) 54     Resp 09/12/23 0828 18     Temp 09/12/23 0828 97.6 F (36.4 C)     Temp Source 09/12/23 0828 Oral     SpO2 09/12/23 0828 98 %     Weight 09/12/23 0825 185 lb (83.9 kg)     Height 09/12/23 0825 5' 2 (1.575 m)     Head Circumference --      Peak Flow --      Pain Score 09/12/23 0820 0     Pain Loc --      Pain Education --      Exclude from Growth Chart --    No data found.  Updated Vital Signs BP 113/76 (BP Location: Right Arm)   Pulse 60   Temp 97.6 F (36.4 C) (Oral)   Resp 18   Ht 5' 2 (1.575 m)   Wt 83.9 kg   LMP 09/04/2023 (Exact Date)   SpO2 98%   BMI 33.84 kg/m   Visual Acuity Right Eye Distance:   Left Eye Distance:   Bilateral Distance:    Right Eye Near:   Left Eye Near:    Bilateral Near:     Physical Exam Vitals reviewed.  Constitutional:      General: She is not in acute distress.    Appearance: She is not toxic-appearing.  Cardiovascular:     Rate and Rhythm: Normal rate and regular rhythm.  Abdominal:     Tenderness: There is no abdominal tenderness.  Skin:    Coloration: Skin is not pale.  Neurological:     Mental Status: She is alert and oriented to person, place, and time.  Psychiatric:        Behavior: Behavior normal.      UC Treatments / Results  Labs (all labs ordered are listed, but only abnormal results are displayed) Labs Reviewed  POCT URINE DIPSTICK - Abnormal; Notable for the following components:      Result Value   Color, UA light yellow (*)    Clarity, UA cloudy (*)    Blood, UA trace-intact (*)    Leukocytes,  UA Small (1+) (*)    All other components within normal limits  URINE CULTURE  CERVICOVAGINAL ANCILLARY ONLY    EKG   Radiology No  results found.  Procedures Procedures (including critical care time)  Medications Ordered in UC Medications - No data to display  Initial Impression / Assessment and Plan / UC Course  I have reviewed the triage vital signs and the nursing notes.  Pertinent labs & imaging results that were available during my care of the patient were reviewed by me and considered in my medical decision making (see chart for details).     Urinalysis shows a trace of RBCs and small amount of leuks. Levaquin  is sent in to treat probable cystitis; it is my hope that if she ends up with anything like chlamydia, that would cover that will also.  Urine culture is sent and we will let her know if she needs to change her antibiotics.  Fluconazole  sent since she states she often gets those taking antibiotics. Final Clinical Impressions(s) / UC Diagnoses   Final diagnoses:  Dysuria     Discharge Instructions      The urinalysis showed a small amount of white blood cells and a little bit of red blood cells.  This could be consistent with a bladder infection.  Levaquin  500 mg--take 1 tablet daily for 7 days  Urine culture is sent, and staff will notify you that it looks like the antibiotic needs to be changed.  Take Pyridium /phenazopyridine  100 mg--1 tablet 3 times daily as needed for urinary pain.  This medication usually makes the urine orange  Take fluconazole  150 mg--1 tablet every 3 days for 2 doses; take this medication if you start having symptoms of a yeast infection.  Staff will notify you if there is anything positive on the swab (or on the blood work if that has been taken at this visit). It can take 2-3 days for the tests to result, depending on the day of the week your test was taken. You will only be notified if there are any positives on the testing; test  results will also go to your MyChart if you are signed up for MyChart.        ED Prescriptions     Medication Sig Dispense Auth. Provider   levofloxacin  (LEVAQUIN ) 500 MG tablet Take 1 tablet (500 mg total) by mouth daily for 7 days. 7 tablet Shashank Kwasnik, Sharlet POUR, MD   phenazopyridine  (PYRIDIUM ) 100 MG tablet Take 1 tablet (100 mg total) by mouth 3 (three) times daily as needed (urinary pain). 10 tablet Luwanna Brossman K, MD   fluconazole  (DIFLUCAN ) 150 MG tablet Take 1 tablet (150 mg total) by mouth every 3 (three) days for 2 doses. 2 tablet Vonna Sharlet POUR, MD      PDMP not reviewed this encounter.   Vonna Sharlet POUR, MD 09/12/23 [id number]

## 2023-09-13 LAB — CERVICOVAGINAL ANCILLARY ONLY
Chlamydia: NEGATIVE
Comment: NEGATIVE
Comment: NEGATIVE
Comment: NORMAL
Neisseria Gonorrhea: NEGATIVE
Trichomonas: NEGATIVE

## 2023-09-14 ENCOUNTER — Ambulatory Visit: Payer: Self-pay

## 2023-09-14 LAB — URINE CULTURE: Culture: 70000 — AB

## 2023-12-21 IMAGING — MG MM DIGITAL SCREENING BILAT W/ TOMO AND CAD
8 series · 9 of 24 positions shown · non-contrast
Comparison: None.

CLINICAL DATA: Screening.

EXAM:
DIGITAL SCREENING BILATERAL MAMMOGRAM WITH TOMOSYNTHESIS AND CAD
TECHNIQUE: Bilateral screening digital craniocaudal and mediolateral oblique
mammograms were obtained. Bilateral screening digital breast
tomosynthesis was performed. The images were evaluated with
computer-aided detection.

[R MLO synth-2D]
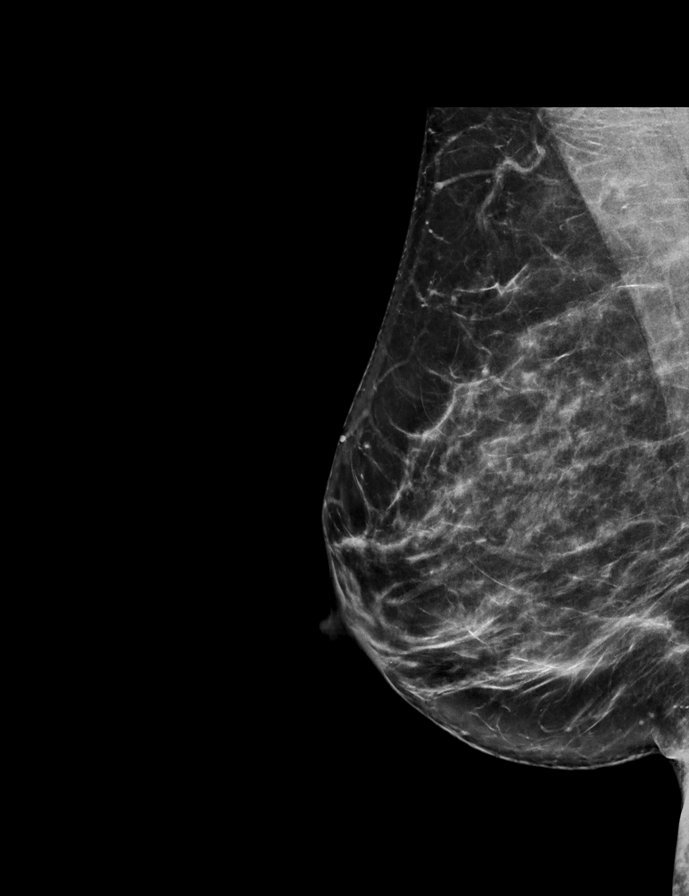

[L MLO synth-2D]
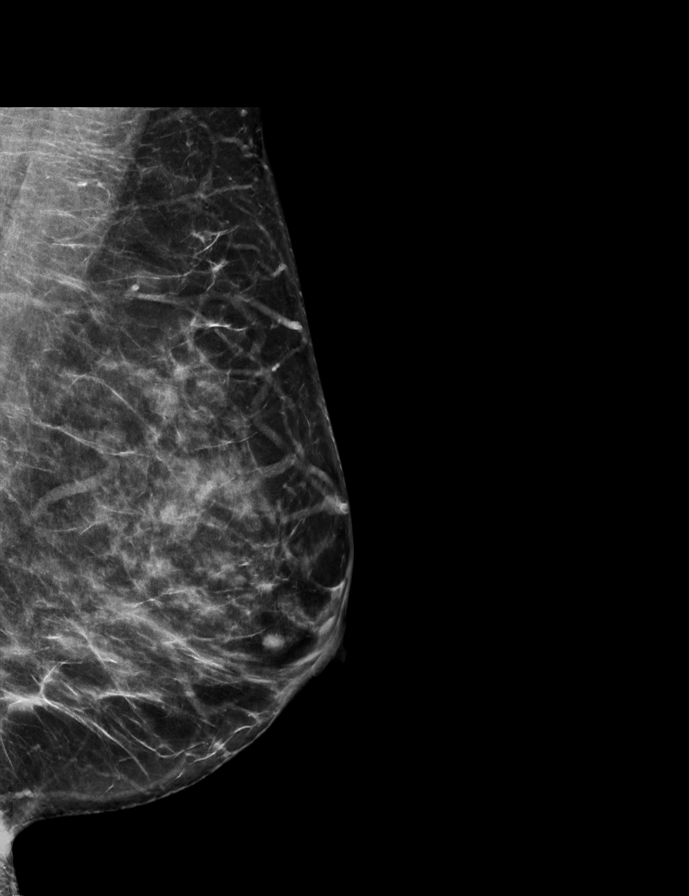

[R CC synth-2D]
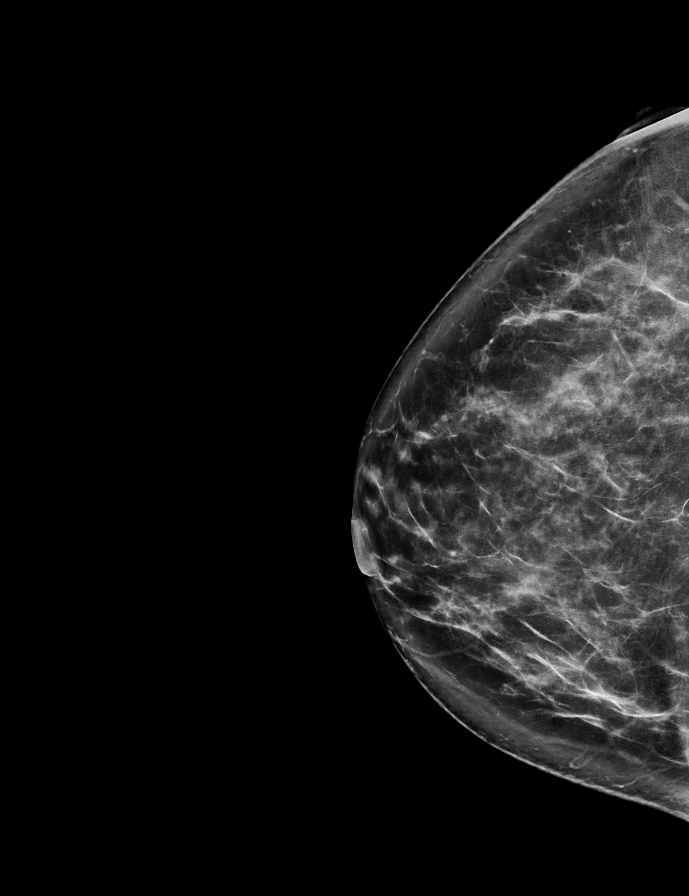

[L CC synth-2D]
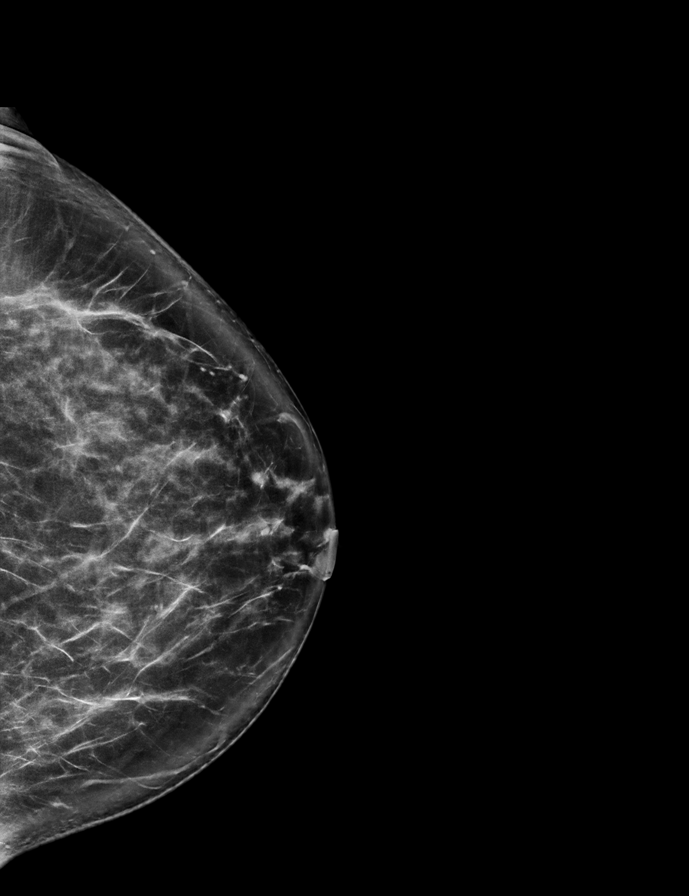

[R MLO tomo · 2 of 73 frames shown]
[frame 24/73]
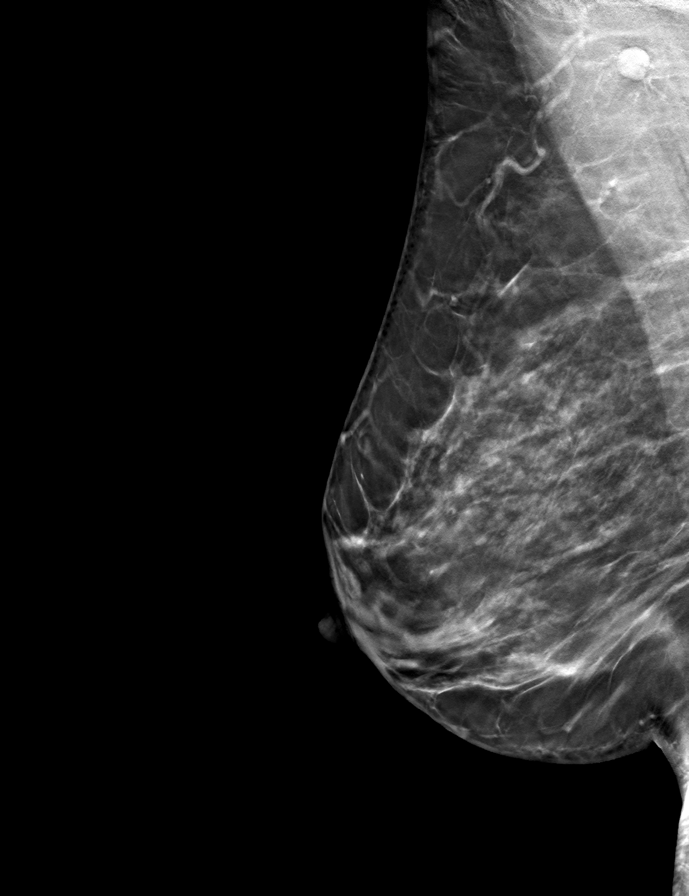
[frame 37/73]
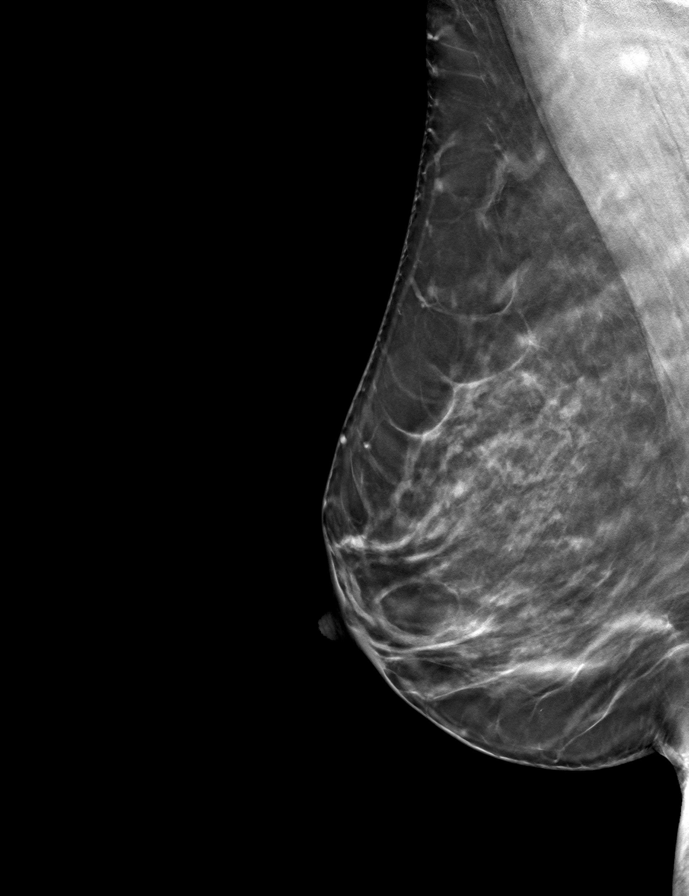

[L MLO tomo · tomo slice 37/73.0]
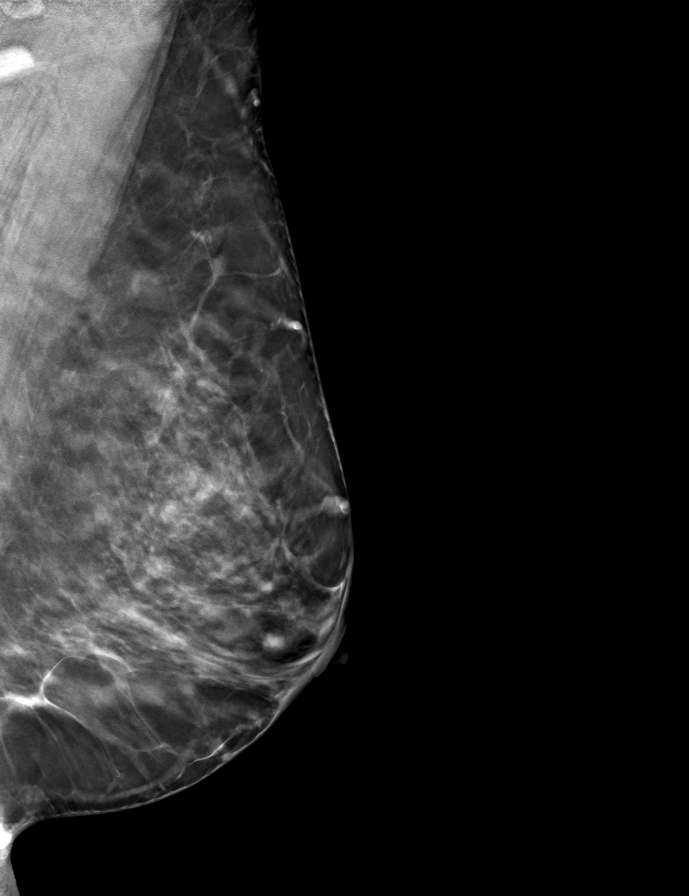

[R CC tomo · tomo slice 39/78.0]
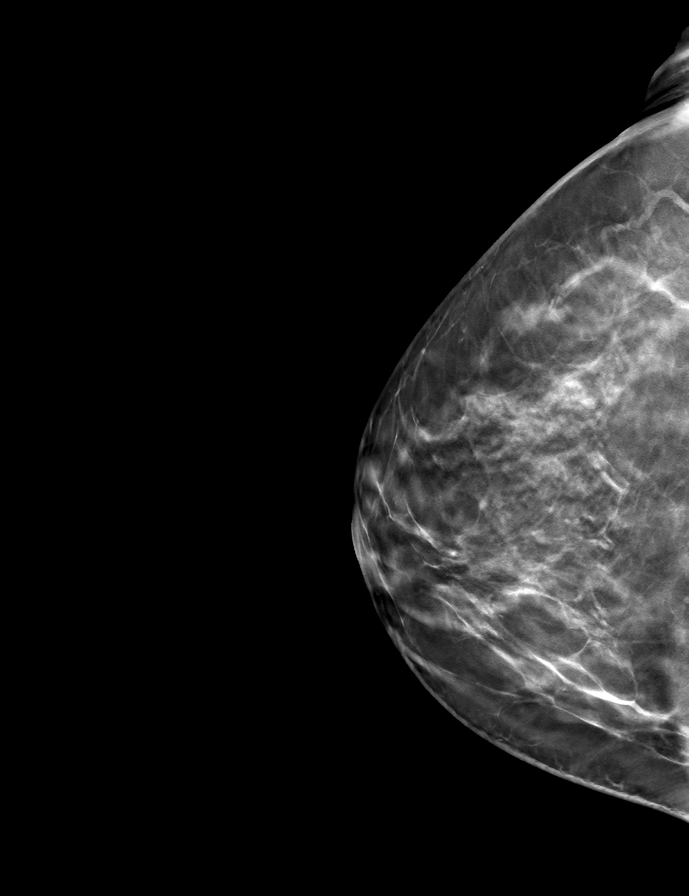

[L CC tomo · tomo slice 37/74.0]
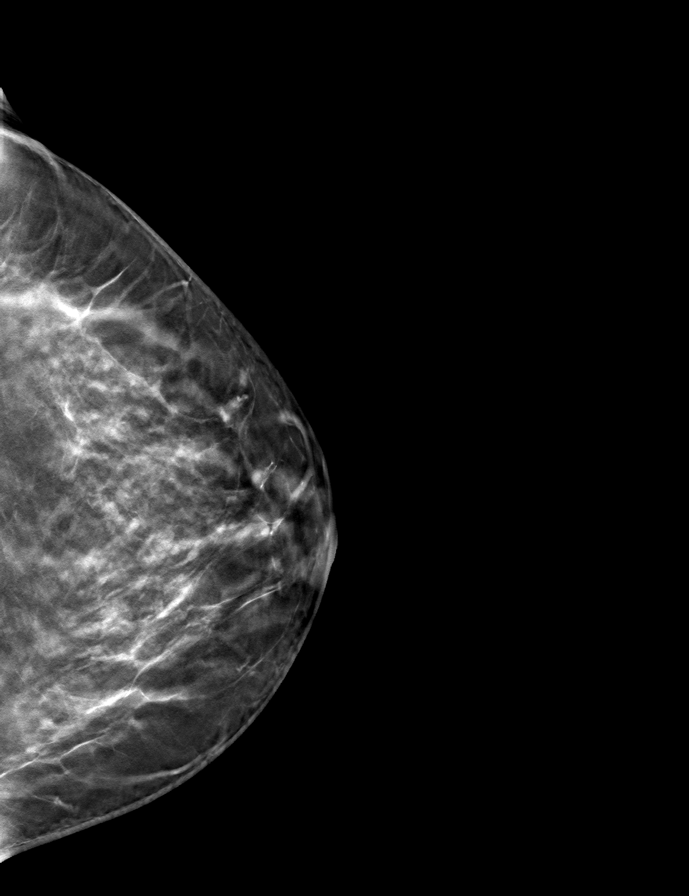

[9 of 24 positions shown; findings below may reference images not displayed]

ACR Breast Density Category c: The breast tissue is heterogeneously
dense, which may obscure small masses.
FINDINGS: In the left breast, a possible asymmetry warrants further
evaluation. In the right breast, no findings suspicious for
malignancy.
IMPRESSION: Further evaluation is suggested for possible asymmetry in the left
breast.

RECOMMENDATION:
Diagnostic mammogram and possibly ultrasound of the left breast.
(Code:M5-1-550)

The patient will be contacted regarding the findings, and additional
imaging will be scheduled.

BI-RADS CATEGORY  0: Incomplete. Need additional imaging evaluation
and/or prior mammograms for comparison.
